# Patient Record
Sex: Female | Born: 1975 | Race: Black or African American | Hispanic: No | Smoking: Never smoker
Health system: Southern US, Community
[De-identification: ages and names within clinical notes are randomized; demographics above are authoritative.]

## PROBLEM LIST (undated history)

## (undated) DIAGNOSIS — E785 Hyperlipidemia, unspecified: Secondary | ICD-10-CM

## (undated) DIAGNOSIS — R87810 Cervical high risk human papillomavirus (HPV) DNA test positive: Secondary | ICD-10-CM

## (undated) DIAGNOSIS — E669 Obesity, unspecified: Secondary | ICD-10-CM

## (undated) HISTORY — DX: Obesity, unspecified: E66.9

## (undated) HISTORY — PX: WISDOM TOOTH EXTRACTION: SHX21

## (undated) HISTORY — DX: Hyperlipidemia, unspecified: E78.5

## (undated) HISTORY — DX: Cervical high risk human papillomavirus (HPV) DNA test positive: R87.810

---

## 2016-01-27 LAB — HM PAP SMEAR: HM PAP: NEGATIVE

## 2016-02-21 LAB — HM PAP SMEAR

## 2016-05-30 ENCOUNTER — Encounter: Payer: Self-pay | Admitting: Physician Assistant

## 2016-05-30 ENCOUNTER — Ambulatory Visit (INDEPENDENT_AMBULATORY_CARE_PROVIDER_SITE_OTHER): Payer: BLUE CROSS/BLUE SHIELD

## 2016-05-30 ENCOUNTER — Ambulatory Visit (INDEPENDENT_AMBULATORY_CARE_PROVIDER_SITE_OTHER): Payer: BLUE CROSS/BLUE SHIELD | Admitting: Physician Assistant

## 2016-05-30 VITALS — BP 138/93 | HR 92 | Ht 64.0 in | Wt 182.0 lb

## 2016-05-30 DIAGNOSIS — K5900 Constipation, unspecified: Secondary | ICD-10-CM | POA: Diagnosis not present

## 2016-05-30 DIAGNOSIS — Z131 Encounter for screening for diabetes mellitus: Secondary | ICD-10-CM | POA: Diagnosis not present

## 2016-05-30 DIAGNOSIS — E669 Obesity, unspecified: Secondary | ICD-10-CM

## 2016-05-30 DIAGNOSIS — R030 Elevated blood-pressure reading, without diagnosis of hypertension: Secondary | ICD-10-CM

## 2016-05-30 DIAGNOSIS — Z23 Encounter for immunization: Secondary | ICD-10-CM | POA: Diagnosis not present

## 2016-05-30 DIAGNOSIS — Z13220 Encounter for screening for lipoid disorders: Secondary | ICD-10-CM | POA: Diagnosis not present

## 2016-05-30 DIAGNOSIS — R1011 Right upper quadrant pain: Secondary | ICD-10-CM | POA: Diagnosis not present

## 2016-05-30 NOTE — Progress Notes (Signed)
Call pt: moderate amt of stool in colon that could be cause of tenderness and discomfort.  Restart probiotic. Start miralax 1 capful OTC twice a day with 8oz of fluid/gaterade until bowel movements are good.

## 2016-05-30 NOTE — Progress Notes (Signed)
   Subjective:    Patient ID: Julie Reese, female    DOB: 09/21/1975, 41 y.o.   MRN: [id number]  HPI  Pt is a 41 yo female who presents to the clinic to establish care.   .. Active Ambulatory Problems    Diagnosis Date Noted  . No Active Ambulatory Problems   Resolved Ambulatory Problems    Diagnosis Date Noted  . No Resolved Ambulatory Problems   No Additional Past Medical History   .Marland Kitchen Family History  Problem Relation Age of Onset  . Diabetes Mother   . Hypertension Mother   . High Cholesterol Father   . Diabetes Maternal Grandmother   . Diabetes Paternal Grandmother    .Marland Kitchen Social History   Social History  . Marital status: Single    Spouse name: N/A  . Number of children: N/A  . Years of education: N/A   Occupational History  . Not on file.   Social History Main Topics  . Smoking status: Never Smoker  . Smokeless tobacco: Never Used  . Alcohol use Yes  . Drug use: No  . Sexual activity: Not Currently   Other Topics Concern  . Not on file   Social History Narrative  . No narrative on file   She is concerned because she felt a "Knot" in her right upper quadrant for a few days. Has since resolved. She feels like she has been burping more. Bowel movements daily and regularly. Denies any melena or hematochezia. She feels "tenderness at times in right upper quadrant". Denies any nausea or vomiting. She admits to trying to get healthy and doing more sit ups recently. She has made numerous diet changes to lose weight as well.et healthy.    Review of Systems  All other systems reviewed and are negative.      Objective:   Physical Exam  Constitutional: She is oriented to person, place, and time. She appears well-developed and well-nourished.  HENT:  Head: Normocephalic and atraumatic.  Neck: Normal range of motion. Neck supple. No thyromegaly present.  Cardiovascular: Normal rate, regular rhythm and normal heart sounds.   Pulmonary/Chest: Effort normal  and breath sounds normal.  Abdominal: Soft. Bowel sounds are normal. She exhibits no distension and no mass. There is no tenderness. There is no rebound and no guarding.  Lymphadenopathy:    She has no cervical adenopathy.  Neurological: She is alert and oriented to person, place, and time.  Skin: Skin is dry.  Psychiatric: She has a normal mood and affect. Her behavior is normal.          Assessment & Plan:  Marland KitchenMarland KitchenDiagnoses and all orders for this visit:  RUQ pain -     DG Abd 1 View; Future -     DG Abd 1 View  Need for Tdap vaccination -     Tdap vaccine greater than or equal to 7yo IM  Obesity (BMI 30.0-34.9) -     TSH  Screening for diabetes mellitus -     COMPLETE METABOLIC PANEL WITH GFR  Screening for lipid disorders -     Lipid panel  Elevated blood pressure reading   Unclear etiology.  Will get abd xray.  Reassured patient of normal abdominal exam.  Will get labs.  Consider starting probiotics.  Follow up in 1 month for CPe.   Xray showed moderate stool burden. Will try miralax.

## 2016-05-31 DIAGNOSIS — E66811 Obesity, class 1: Secondary | ICD-10-CM

## 2016-05-31 DIAGNOSIS — R1011 Right upper quadrant pain: Secondary | ICD-10-CM | POA: Insufficient documentation

## 2016-05-31 DIAGNOSIS — E669 Obesity, unspecified: Secondary | ICD-10-CM | POA: Insufficient documentation

## 2016-05-31 HISTORY — DX: Obesity, unspecified: E66.9

## 2016-05-31 HISTORY — DX: Obesity, class 1: E66.811

## 2016-06-02 LAB — LIPID PANEL
CHOL/HDL RATIO: 5.5 ratio — AB (ref <5.0)
Cholesterol: 221 mg/dL — ABNORMAL HIGH (ref <200)
HDL: 40 mg/dL — ABNORMAL LOW (ref >50)
LDL CALC: 169 mg/dL — AB (ref <100)
Triglycerides: 61 mg/dL (ref <150)
VLDL: 12 mg/dL (ref <30)

## 2016-06-02 LAB — COMPLETE METABOLIC PANEL WITH GFR
ALT: 10 U/L (ref 6–29)
AST: 12 U/L (ref 10–30)
Albumin: 4.8 g/dL (ref 3.6–5.1)
Alkaline Phosphatase: 47 U/L (ref 33–115)
BUN: 10 mg/dL (ref 7–25)
CO2: 26 mmol/L (ref 20–31)
Calcium: 9.4 mg/dL (ref 8.6–10.2)
Chloride: 103 mmol/L (ref 98–110)
Creat: 0.83 mg/dL (ref 0.50–1.10)
GFR, Est African American: >89 mL/min (ref >=60)
GFR, Est Non African American: >89 mL/min (ref >=60)
GLUCOSE: 87 mg/dL (ref 65–99)
POTASSIUM: 4.1 mmol/L (ref 3.5–5.3)
SODIUM: 138 mmol/L (ref 135–146)
Total Bilirubin: 0.7 mg/dL (ref 0.2–1.2)
Total Protein: 8 g/dL (ref 6.1–8.1)

## 2016-06-02 LAB — TSH: TSH: 1.1 mIU/L

## 2016-06-05 ENCOUNTER — Encounter: Payer: Self-pay | Admitting: Physician Assistant

## 2016-06-05 DIAGNOSIS — E785 Hyperlipidemia, unspecified: Secondary | ICD-10-CM

## 2016-06-05 HISTORY — DX: Hyperlipidemia, unspecified: E78.5

## 2016-06-05 NOTE — Progress Notes (Signed)
Call pt: thyroid normal.  Kidney, liver, glucose.  LDL is elevated. Low fat diet, exercise could help. Goal under 100. I would consider statin daily medication. Will you consider.

## 2016-06-18 ENCOUNTER — Encounter: Payer: Self-pay | Admitting: Physician Assistant

## 2016-06-21 ENCOUNTER — Ambulatory Visit (INDEPENDENT_AMBULATORY_CARE_PROVIDER_SITE_OTHER): Payer: BLUE CROSS/BLUE SHIELD | Admitting: Physician Assistant

## 2016-06-21 ENCOUNTER — Encounter: Payer: Self-pay | Admitting: Physician Assistant

## 2016-06-21 VITALS — BP 138/78 | HR 103 | Temp 98.5°F | Ht 64.0 in | Wt 182.0 lb

## 2016-06-21 DIAGNOSIS — J069 Acute upper respiratory infection, unspecified: Secondary | ICD-10-CM | POA: Diagnosis not present

## 2016-06-21 DIAGNOSIS — R05 Cough: Secondary | ICD-10-CM | POA: Diagnosis not present

## 2016-06-21 DIAGNOSIS — R059 Cough, unspecified: Secondary | ICD-10-CM

## 2016-06-21 MED ORDER — IPRATROPIUM BROMIDE 0.06 % NA SOLN: 2.0000 | Freq: Four times a day (QID) | NASAL | 2 refills | Status: DC

## 2016-06-21 MED ORDER — BENZONATATE 200 MG PO CAPS: 200.0000 mg | ORAL_CAPSULE | Freq: Three times a day (TID) | ORAL | 0 refills | Status: DC | PRN

## 2016-06-21 NOTE — Progress Notes (Signed)
   Subjective:    Patient ID: Julie Reese, female    DOB: [birth year], 41 y.o.   MRN: [id number]  HPI  Pt is a 41 yo female who presents to the clinic with cough and congestion since Sunday, 3 days ago. Sunday she ran a low grade fever that has resolved. Monday she felt more fatigued. She has been taking delsym, mucinex, tylenol and sudafed. It is helping. She denies any problems sleeping. She denies any sinus pressure, ear pain, ST, wheezing or SOB.    Review of Systems    see HPI.  Objective:   Physical Exam  Constitutional: She is oriented to person, place, and time. She appears well-developed and well-nourished.  HENT:  Head: Normocephalic and atraumatic.  Right Ear: External ear normal.  Left Ear: External ear normal.  Mouth/Throat: Oropharynx is clear and moist.  TM's clear bilaterally.  Negative for any sinus tenderness.  Bilateral nasal turbinates red and swollen.    Eyes: Conjunctivae are normal. Right eye exhibits no discharge. Left eye exhibits no discharge.  Neck: Normal range of motion. Neck supple.  Cardiovascular: Normal rate, regular rhythm and normal heart sounds.   Pulmonary/Chest: Effort normal and breath sounds normal.  Lymphadenopathy:    She has cervical adenopathy.  Neurological: She is alert and oriented to person, place, and time.  Psychiatric: She has a normal mood and affect. Her behavior is normal.          Assessment & Plan:  Marland KitchenMarland KitchenNilda was seen today for cough.  Diagnoses and all orders for this visit:  Acute upper respiratory infection -     benzonatate (TESSALON) 200 MG capsule; Take 1 capsule (200 mg total) by mouth 3 (three) times daily as needed for cough. -     ipratropium (ATROVENT) 0.06 % nasal spray; Place 2 sprays into both nostrils 4 (four) times daily.  Cough -     benzonatate (TESSALON) 200 MG capsule; Take 1 capsule (200 mg total) by mouth 3 (three) times daily as needed for cough. -     ipratropium (ATROVENT) 0.06 %  nasal spray; Place 2 sprays into both nostrils 4 (four) times daily.   Symptomatic care discussed. Continue OTC mucinex, delsym, tylenol. May add ibuprofen as needed in alternation with tylenol. Tessalon given as needed as well as nasal spray. Follow up as needed.

## 2016-06-21 NOTE — Patient Instructions (Signed)
Upper Respiratory Infection, Adult Most upper respiratory infections (URIs) are caused by a virus. A URI affects the nose, throat, and upper air passages. The most common type of URI is often called "the common cold." Follow these instructions at home:  Take medicines only as told by your doctor.  Gargle warm saltwater or take cough drops to comfort your throat as told by your doctor.  Use a warm mist humidifier or inhale steam from a shower to increase air moisture. This may make it easier to breathe.  Drink enough fluid to keep your pee (urine) clear or pale yellow.  Eat soups and other clear broths.  Have a healthy diet.  Rest as needed.  Go back to work when your fever is gone or your doctor says it is okay.  You may need to stay home longer to avoid giving your URI to others.  You can also wear a face mask and wash your hands often to prevent spread of the virus.  Use your inhaler more if you have asthma.  Do not use any tobacco products, including cigarettes, chewing tobacco, or electronic cigarettes. If you need help quitting, ask your doctor. Contact a doctor if:  You are getting worse, not better.  Your symptoms are not helped by medicine.  You have chills.  You are getting more short of breath.  You have brown or red mucus.  You have yellow or brown discharge from your nose.  You have pain in your face, especially when you bend forward.  You have a fever.  You have puffy (swollen) neck glands.  You have pain while swallowing.  You have white areas in the back of your throat. Get help right away if:  You have very bad or constant:  Headache.  Ear pain.  Pain in your forehead, behind your eyes, and over your cheekbones (sinus pain).  Chest pain.  You have long-lasting (chronic) lung disease and any of the following:  Wheezing.  Long-lasting cough.  Coughing up blood.  A change in your usual mucus.  You have a stiff neck.  You have  changes in your:  Vision.  Hearing.  Thinking.  Mood. This information is not intended to replace advice given to you by your health care provider. Make sure you discuss any questions you have with your health care provider. Document Released: 09/13/2007 Document Revised: 11/28/2015 Document Reviewed: 07/02/2013 Elsevier Interactive Patient Education  2017 [employer] Inc[employer]  

## 2016-06-27 ENCOUNTER — Encounter: Payer: Self-pay | Admitting: Physician Assistant

## 2016-06-27 ENCOUNTER — Ambulatory Visit (INDEPENDENT_AMBULATORY_CARE_PROVIDER_SITE_OTHER): Payer: BLUE CROSS/BLUE SHIELD | Admitting: Physician Assistant

## 2016-06-27 VITALS — BP 131/78 | HR 85 | Temp 98.1°F | Ht 64.0 in | Wt 179.0 lb

## 2016-06-27 DIAGNOSIS — E7800 Pure hypercholesterolemia, unspecified: Secondary | ICD-10-CM | POA: Diagnosis not present

## 2016-06-27 DIAGNOSIS — R058 Other specified cough: Secondary | ICD-10-CM

## 2016-06-27 DIAGNOSIS — R87810 Cervical high risk human papillomavirus (HPV) DNA test positive: Secondary | ICD-10-CM

## 2016-06-27 DIAGNOSIS — M67471 Ganglion, right ankle and foot: Secondary | ICD-10-CM

## 2016-06-27 DIAGNOSIS — R05 Cough: Secondary | ICD-10-CM

## 2016-06-27 DIAGNOSIS — E669 Obesity, unspecified: Secondary | ICD-10-CM

## 2016-06-27 DIAGNOSIS — Z0000 Encounter for general adult medical examination without abnormal findings: Secondary | ICD-10-CM | POA: Diagnosis not present

## 2016-06-27 MED ORDER — PREDNISONE 50 MG PO TABS
ORAL_TABLET | ORAL | 0 refills | Status: DC
Start: 2016-06-27 — End: 2016-10-18

## 2016-06-27 NOTE — Patient Instructions (Addendum)
Red yeast rice 1200mg  twice a day.  Vitamin D 1000 units and calcium 4 servings a day.    Dyslipidemia Dyslipidemia is an imbalance of waxy, fat-like substances (lipids) in the blood. The body needs lipids in small amounts. Dyslipidemia often involves a high level of cholesterol or triglycerides, which are types of lipids. Common forms of dyslipidemia include:  High levels of bad cholesterol (LDL cholesterol). LDL is the type of cholesterol that causes fatty deposits (plaques) to build up in the blood vessels that carry blood away from your heart (arteries).  Low levels of good cholesterol (HDL cholesterol). HDL cholesterol is the type of cholesterol that protects against heart disease. High levels of HDL remove the LDL buildup from arteries.  High levels of triglycerides. Triglycerides are a fatty substance in the blood that is linked to a buildup of plaques in the arteries. You can develop dyslipidemia because of the genes you are born with (primary dyslipidemia) or changes that occur during your life (secondary dyslipidemia), or as a side effect of certain medical treatments. What are the causes? Primary dyslipidemia is caused by changes (mutations) in genes that are passed down through families (inherited). These mutations cause several types of dyslipidemia. Mutations can result in disorders that make the body produce too much LDL cholesterol or triglycerides, or not enough HDL cholesterol. These disorders may lead to heart disease, arterial disease, or stroke at an early age. Causes of secondary dyslipidemia include certain lifestyle choices and diseases that lead to dyslipidemia, such as:  Eating a diet that is high in animal fat.  Not getting enough activity or exercise (having a sedentary lifestyle).  Having diabetes, kidney disease, liver disease, or thyroid disease.  Drinking large amounts of alcohol.  Using certain types of drugs. What increases the risk? You may be at greater  risk for dyslipidemia if you are an older man or if you are a woman who has gone through menopause. Other risk factors include:  Having a family history of dyslipidemia.  Taking certain medicines, including birth control pills, steroids, some diuretics, beta-blockers, and some medicines forHIV.  Smoking cigarettes.  Eating a high-fat diet.  Drinking large amounts of alcohol.  Having certain medical conditions such as diabetes, polycystic ovary syndrome (PCOS), pregnancy, kidney disease, liver disease, or hypothyroidism.  Not exercising regularly.  Being overweight or obese with too much belly fat. What are the signs or symptoms? Dyslipidemia does not usually cause any symptoms. Very high lipid levels can cause fatty bumps under the skin (xanthomas) or a white or gray ring around the black center (pupil) of the eye. Very high triglyceride levels can cause inflammation of the pancreas (pancreatitis). How is this diagnosed? Your health care provider may diagnose dyslipidemia based on a routine blood test (fasting blood test). Because most people do not have symptoms of the condition, this blood testing (lipid profile) is done on adults age 37 and older and is repeated every 5 years. This test checks:  Total cholesterol. This is a measure of the total amount of cholesterol in your blood, including LDL cholesterol, HDL cholesterol, and triglycerides. A healthy number is below 200.  LDL cholesterol. The target number for LDL cholesterol is different for each person, depending on individual risk factors. For most people, a number below 100 is healthy. Ask your health care provider what your LDL cholesterol number should be.  HDL cholesterol. An HDL level of 60 or higher is best because it helps to protect against heart disease. A number  below 69 for men or below 47 for women increases the risk for heart disease.  Triglycerides. A healthy triglyceride number is below 150. If your lipid profile  is abnormal, your health care provider may do other blood tests to get more information about your condition. How is this treated? Treatment depends on the type of dyslipidemia that you have and your other risk factors for heart disease and stroke. Your health care provider will have a target range for your lipid levels based on this information. For many people, treatment starts with lifestyle changes, such as diet and exercise. Your health care provider may recommend that you:  Get regular exercise.  Make changes to your diet.  Quit smoking if you smoke. If diet changes and exercise do not help you reach your goals, your health care provider may also prescribe medicine to lower lipids. The most commonly prescribed type of medicine lowers your LDL cholesterol (statin drug). If you have a high triglyceride level, your provider may prescribe another type of drug (fibrate) or an omega-3 fish oil supplement, or both. Follow these instructions at home:  Take over-the-counter and prescription medicines only as told by your health care provider. This includes supplements.  Get regular exercise. Start an aerobic exercise and strength training program as told by your health care provider. Ask your health care provider what activities are safe for you. Your health care provider may recommend:  30 minutes of aerobic activity 4-6 days a week. Brisk walking is an example of aerobic activity.  Strength training 2 days a week.  Eat a healthy diet as told by your health care provider. This can help you reach and maintain a healthy weight, lower your LDL cholesterol, and raise your HDL cholesterol. It may help to work with a diet and nutrition specialist (dietitian) to make a plan that is right for you. Your dietitian or health care provider may recommend:  Limiting your calories, if you are overweight.  Eating more fruits, vegetables, whole grains, fish, and lean meats.  Limiting saturated fat, trans  fat, and cholesterol.  Follow instructions from your health care provider or dietitian about eating or drinking restrictions.  Limit alcohol intake to no more than one drink per day for nonpregnant women and two drinks per day for men. One drink equals 12 oz of beer, 5 oz of wine, or 1 oz of hard liquor.  Do not use any products that contain nicotine or tobacco, such as cigarettes and e-cigarettes. If you need help quitting, ask your health care provider.  Keep all follow-up visits as told by your health care provider. This is important. Contact a health care provider if:  You are having trouble sticking to your exercise or diet plan.  You are struggling to quit smoking or control your use of alcohol. Summary  Dyslipidemia is an imbalance of waxy, fat-like substances (lipids) in the blood. The body needs lipids in small amounts. Dyslipidemia often involves a high level of cholesterol or triglycerides, which are types of lipids.  Treatment depends on the type of dyslipidemia that you have and your other risk factors for heart disease and stroke.  For many people, treatment starts with lifestyle changes, such as diet and exercise. Your health care provider may also prescribe medicine to lower lipids. This information is not intended to replace advice given to you by your health care provider. Make sure you discuss any questions you have with your health care provider. Document Released: 04/01/2013 Document Revised: 11/22/2015 Document  Reviewed: 11/22/2015 Elsevier Interactive Patient Education  2017 [employer] Inc.   Ganglion Cyst A ganglion cyst is a noncancerous, fluid-filled lump that occurs near joints or tendons. The ganglion cyst grows out of a joint or the lining of a tendon. It most often develops in the hand or wrist, but it can also develop in the shoulder, elbow, hip, knee, ankle, or foot. The round or oval ganglion cyst can be the size of a pea or larger than a grape. Increased  activity may enlarge the size of the cyst because more fluid starts to build up. What are the causes? It is not known what causes a ganglion cyst to grow. However, it may be related to:  Inflammation or irritation around the joint.  An injury.  Repetitive movements or overuse.  Arthritis. What increases the risk? Risk factors include:  Being a woman.  Being age 31-50. What are the signs or symptoms? Symptoms may include:  A lump. This most often appears on the hand or wrist, but it can occur in other areas of the body.  Tingling.  Pain.  Numbness.  Muscle weakness.  Weak grip.  Less movement in a joint. How is this diagnosed? Ganglion cysts are most often diagnosed based on a physical exam. Your health care provider will feel the lump and may shine a light alongside it. If it is a ganglion cyst, a light often shines through it. Your health care provider may order an X-ray, ultrasound, or MRI to rule out other conditions. How is this treated? Ganglion cysts usually go away on their own without treatment. If pain or other symptoms are involved, treatment may be needed. Treatment is also needed if the ganglion cyst limits your movement or if it gets infected. Treatment may include:  Wearing a brace or splint on your wrist or finger.  Taking anti-inflammatory medicine.  Draining fluid from the lump with a needle (aspiration).  Injecting a steroid into the joint.  Surgery to remove the ganglion cyst. Follow these instructions at home:  Do not press on the ganglion cyst, poke it with a needle, or hit it.  Take medicines only as directed by your health care provider.  Wear your brace or splint as directed by your health care provider.  Watch your ganglion cyst for any changes.  Keep all follow-up visits as directed by your health care provider. This is important. Contact a health care provider if:  Your ganglion cyst becomes larger or more painful.  You have  increased redness, red streaks, or swelling.  You have pus coming from the lump.  You have weakness or numbness in the affected area.  You have a fever or chills. This information is not intended to replace advice given to you by your health care provider. Make sure you discuss any questions you have with your health care provider. Document Released: 03/24/2000 Document Revised: 09/02/2015 Document Reviewed: 09/09/2013 Elsevier Interactive Patient Education  2017 [employer].

## 2016-06-27 NOTE — Progress Notes (Signed)
Subjective:    Patient ID: Julie Reese, female    DOB: Nov 28, 1975, 40 y.o.   MRN: [id number]  HPI Pt is a 41 yo female who presents to the clinic CPE.   She had last pap done in 01/2016 was HPV positive will follow back up with them in October. No need for mammogram.   She has already had labs drawn will discuss today.   She is working on obesity with diet and exercise.   She was seen for URI and has a persisent cough. Does not feel bad. She denies any sinus pressure, headache, fever, chills, SOB.   She does have a knot on the top of her right foot that she would like looked at. It has been there for years after dropping a can of food on the top of it. It only bothers her when she wears certain shoes that rub the area. She is not taking anything for it.   .. Active Ambulatory Problems    Diagnosis Date Noted  . Obesity (BMI 30.0-34.9) 05/31/2016  . RUQ pain 05/31/2016  . Hyperlipidemia 06/05/2016  . Ganglion cyst of right foot 06/27/2016  . Post-viral cough syndrome 06/27/2016  . Pap smear of cervix shows high risk HPV present 06/28/2016   Resolved Ambulatory Problems    Diagnosis Date Noted  . No Resolved Ambulatory Problems   No Additional Past Medical History   .Marland Kitchen Family History  Problem Relation Age of Onset  . Diabetes Mother   . Hypertension Mother   . High Cholesterol Father   . Diabetes Maternal Grandmother   . Diabetes Paternal Grandmother    .Marland Kitchen Social History   Social History  . Marital status: Single    Spouse name: N/A  . Number of children: N/A  . Years of education: N/A   Occupational History  . Not on file.   Social History Main Topics  . Smoking status: Never Smoker  . Smokeless tobacco: Never Used  . Alcohol use Yes  . Drug use: No  . Sexual activity: Not Currently   Other Topics Concern  . Not on file   Social History Narrative  . No narrative on file     Review of Systems  All other systems reviewed and are  negative.      Objective:   Physical Exam  Constitutional: She is oriented to person, place, and time. She appears well-developed and well-nourished.  HENT:  Head: Normocephalic and atraumatic.  Right Ear: External ear normal.  Left Ear: External ear normal.  Nose: Nose normal.  Mouth/Throat: Oropharynx is clear and moist. No oropharyngeal exudate.  Eyes: Conjunctivae and EOM are normal. Pupils are equal, round, and reactive to light. Right eye exhibits no discharge. Left eye exhibits no discharge.  Neck: Normal range of motion. Neck supple. No thyromegaly present.  Cardiovascular: Normal rate, regular rhythm and normal heart sounds.   Pulmonary/Chest: Effort normal and breath sounds normal. She has no wheezes.  Abdominal: Soft. Bowel sounds are normal. She exhibits no distension. There is no tenderness. There is no rebound and no guarding.  Musculoskeletal:  1cm by 1cm cystic structure on dorsal foot. Light is able to pass through it.  Very minimal tenderness to palpation.  NROM of right foot.  Strength 5/5.   Lymphadenopathy:    She has no cervical adenopathy.  Neurological: She is alert and oriented to person, place, and time. She has normal reflexes. No cranial nerve deficit.  Psychiatric: She has a normal mood  and affect. Her behavior is normal.          Assessment & Plan:  Marland KitchenMarland KitchenDiagnoses and all orders for this visit:  Routine physical examination  Pure hypercholesterolemia  Obesity (BMI 30.0-34.9)  Post-viral cough syndrome  Ganglion cyst of right foot -     Ambulatory referral to Podiatry  Pap smear of cervix shows high risk HPV present  Other orders -     predniSONE (DELTASONE) 50 MG tablet; Take one tablet for 5 days.  .. Depression screen [facility] 2/9 06/27/2016  Decreased Interest 0  Down, Depressed, Hopeless 0  PHQ - 2 Score 0    Encouraged red yeast rice and making low fat diet changes with exercise. Will recheck lipid level in 6 months.  At least 150  minutes of exercise a week.  Keep calories to around 1500 a day.   Cough likely post viral. Reassurance given today that lungs sound great. Prednisone for 5 days given.   Continue to follow with GYN due to high risk HPV.   Referral made to consider removal of cyst on right foot.

## 2016-06-28 DIAGNOSIS — R87810 Cervical high risk human papillomavirus (HPV) DNA test positive: Secondary | ICD-10-CM

## 2016-06-28 HISTORY — DX: Cervical high risk human papillomavirus (HPV) DNA test positive: R87.810

## 2016-07-03 ENCOUNTER — Encounter: Payer: Self-pay | Admitting: Podiatry

## 2016-07-03 ENCOUNTER — Ambulatory Visit (INDEPENDENT_AMBULATORY_CARE_PROVIDER_SITE_OTHER): Payer: BLUE CROSS/BLUE SHIELD | Admitting: Podiatry

## 2016-07-03 VITALS — BP 116/79 | HR 76 | Ht 64.0 in | Wt 179.0 lb

## 2016-07-03 DIAGNOSIS — D2121 Benign neoplasm of connective and other soft tissue of right lower limb, including hip: Secondary | ICD-10-CM | POA: Diagnosis not present

## 2016-07-03 DIAGNOSIS — M79671 Pain in right foot: Secondary | ICD-10-CM | POA: Diagnosis not present

## 2016-07-03 DIAGNOSIS — M21969 Unspecified acquired deformity of unspecified lower leg: Secondary | ICD-10-CM | POA: Diagnosis not present

## 2016-07-03 NOTE — Patient Instructions (Signed)
Seen for multiple foot issues, right great toe nail, knot on right foot. Noted of tight Achilles tendon on both, fibrous knot on right dorsum, mild discolored right great toe nail, and elevated first ray right. Need daily stretch exercise. Need Custom orthotics. Metatarsal binder dispensed x 1.

## 2016-07-03 NOTE — Progress Notes (Signed)
SUBJECTIVE: 41 y.o. year old female presents complaining of a palpable mass over right foot, and nail problem on right great toe. Experiencing occasonal pain over the right foot over mid tarsal area with raised mass. Hurts only with certain shoes. The mass increases in size at times. Also having problem with toe nail problem on right. Hx of broken right ankle and sprain left ankle in 2009.  REVIEW OF SYSTEMS: Pertinent items noted in HPI and remainder of comprehensive ROS otherwise negative.  OBJECTIVE: DERMATOLOGIC EXAMINATION: Discolored medial and lateral nail margin right great toe. Mild lift at distal end right great toe.  VASCULAR EXAMINATION OF LOWER LIMBS: All pedal pulses are palpable with normal pulsation.  Prominent dorsalis pedis artery over the palpable mass right foot. Capillary Filling times within 3 seconds in all digits.  No edema or erythema noted. Temperature gradient from tibial crest to dorsum of foot is within normal bilateral.  NEUROLOGIC EXAMINATION OF THE LOWER LIMBS: All epicritic and tactile sensations grossly intact.  MUSCULOSKELETAL EXAMINATION: Positive for bunion deformity L>L. Excess sagittal plane motion of the first ray bilateral. Tight Achilles tendon bilateral. Firma palpable mass, 1 cm in diameter over dorsum just lateral to Anterior tibialis tendon right foot.  ASSESSMENT: Ankle Equinus bilateral. Bunion left. 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   PLAN: Reviewed clinical findings and available treatment options. Metatarsal binder dispensed to use when the mass becomes swollen or enlarged. May need custom orthotics.

## 2016-07-11 ENCOUNTER — Encounter: Payer: Self-pay | Admitting: Physician Assistant

## 2016-10-18 ENCOUNTER — Encounter: Payer: Self-pay | Admitting: Family Medicine

## 2016-10-18 ENCOUNTER — Ambulatory Visit (INDEPENDENT_AMBULATORY_CARE_PROVIDER_SITE_OTHER): Payer: BLUE CROSS/BLUE SHIELD | Admitting: Family Medicine

## 2016-10-18 DIAGNOSIS — M1712 Unilateral primary osteoarthritis, left knee: Secondary | ICD-10-CM | POA: Diagnosis not present

## 2016-10-18 DIAGNOSIS — M7652 Patellar tendinitis, left knee: Secondary | ICD-10-CM | POA: Diagnosis not present

## 2016-10-18 MED ORDER — DICLOFENAC SODIUM 1 % TD GEL: 4.0000 g | Freq: Four times a day (QID) | TRANSDERMAL | 11 refills | Status: DC

## 2016-10-18 NOTE — Patient Instructions (Signed)
Thank you for coming in today. Do the home exercises.  Apply voltaren gel.  Recheck in 2-4 weeks    Patellar Tendinitis Rehab Ask your health care provider which exercises are safe for you. Do exercises exactly as told by your health care provider and adjust them as directed. It is normal to feel mild stretching, pulling, tightness, or discomfort as you do these exercises, but you should stop right away if you feel sudden pain or your pain gets worse.Do not begin these exercises until told by your health care provider. Stretching and range of motion exercises This exercise warms up your muscles and joints and improves the movement and flexibility of your knee. This exercise also helps to relieve pain and stiffness. Exercise A: Hamstring, doorway  1. Lie on your back in front of a doorway with your __________ leg resting against the wall and your other leg flat on the floor in the doorway. There should be a slight bend in your __________ knee. 2. Straighten your __________ knee. You should feel a stretch behind your knee or thigh. If you do not, scoot your buttocks closer to the door. 3. Hold this position for __________ seconds. Repeat __________ times. Complete this stretch __________ times a day. Strengthening exercises These exercises build strength and endurance in your knee. Endurance is the ability to use your muscles for a long time, even after they get tired. Exercise B: Quadriceps, isometric  1. Lie on your back with your __________ leg extended and your other knee bent. 2. Slowly tense the muscles in the front of your __________ thigh. When you do this, you should see your kneecap slide up toward your hip or see increased dimpling just above the knee. This motion will push the back of your knee toward the floor. If this is painful, try putting a rolled-up hand towel under your knee to support it in a bent position. Change the size of the towel to find a position that allows you to do  this exercise without any pain. 3. For __________ seconds, hold the muscle as tight as you can without increasing your pain. 4. Relax the muscles slowly and completely. Repeat __________ times. Complete this exercise __________ times a day. Exercise C: Straight leg raises ( quadriceps) 1. Lie on your back with your __________ leg extended and your other knee bent. 2. Tense the muscles in the front of your __________ thigh. When you do this, you should see your kneecap slide up or see increased dimpling just above the knee. 3. Keep these muscles tight as you raise your leg 4-6 inches (10-15 cm) off the floor. Do not let your moving knee bend. 4. Hold this position for __________ seconds. 5. Keep these muscles tense as you slowly lower your leg. 6. Relax your muscles slowly and completely. Repeat __________ times. Complete this exercise __________ times a day. Exercise D: Squats 1. Stand in front of a table, with your feet and knees pointing straight ahead. You may rest your hands on the table for balance but not for support. 2. Slowly bend your knees and lower your hips like you are going to sit in a chair. ? Keep your weight over your heels, not over your toes. ? Keep your lower legs upright so they are parallel with the table legs. ? Do not let your hips go lower than your knees. ? Do not bend lower than told by your health care provider. ? If your knee pain increases, do not bend as  low. 3. Hold the squat position for __________ seconds. 4. Slowly push with your legs to return to standing. Do not use your hands to pull yourself to standing. Repeat __________ times. Complete this exercise __________ times a day. Exercise E: Step-downs 1. Stand on the edge of a step. 2. Keeping your weight over your __________ heel, slowly bend your __________ knee to bring your __________ heel toward the floor. Lower your heel as far as you can while keeping control and without increasing any  discomfort. ? Do not let your __________ knee come forward. ? Use your leg muscles, not gravity, to lower your body. ? Hold a wall or rail for balance if needed. 3. Slowly push through your heel to lift your body weight back up. 4. Return to the starting position. Repeat __________ times. Complete this exercise __________ times a day. Exercise F: Straight leg raises ( hip abductors) 1. Lie on your side with your __________ leg in the top position. Lie so your head, shoulder, knee, and hip line up. You may bend your lower knee to help you keep your balance. 2. Roll your hips slightly forward, so that your hips are stacked directly over each other and your __________ knee is facing forward. 3. Leading with your heel, lift your top leg 4-6 inches (10-15 cm). You should feel the muscles in your outer hip lifting. ? Do not let your foot drift forward. ? Do not let your knee roll toward the ceiling. 4. Hold this position for __________ seconds. 5. Slowly lower your leg to the starting position. 6. Let your muscles relax completely after each repetition. Repeat __________ times. Complete this exercise __________ times a day. This information is not intended to replace advice given to you by your health care provider. Make sure you discuss any questions you have with your health care provider. Document Released: 03/27/2005 Document Revised: 12/02/2015 Document Reviewed: 12/29/2014 Elsevier Interactive Patient Education  [employer].

## 2016-10-18 NOTE — Progress Notes (Signed)
   Julie Reese is a 41 y.o. female who presents to [facility] today for left knee pain and swelling. Patient notes a one-week history of left anterior knee pain. Patient squatted and felt a pop in her left knee. She notes pain along the anterior aspect of the knee worse with climbing stairs. She has been using ice and menthol-containing rubs which have helped. She denies significant locking or catching. No radiating pain weakness or numbness.    No past medical history on file. Past Surgical History:  Procedure Laterality Date  . WISDOM TOOTH EXTRACTION     Social History  Substance Use Topics  . Smoking status: Never Smoker  . Smokeless tobacco: Never Used  . Alcohol use Yes     ROS:  As above   Medications: Current Outpatient Prescriptions  Medication Sig Dispense Refill  . Multiple Vitamin (MULTIVITAMIN) capsule Take by mouth.    . Probiotic Product (PROBIOTIC-10 PO) Take by mouth.    . diclofenac sodium (VOLTAREN) 1 % GEL Apply 4 g topically 4 (four) times daily. To affected joint. Pt failed Ibuprofen and Naproxen 100 g 11   No current facility-administered medications for this visit.    Allergies  Allergen Reactions  . Percocet [Oxycodone-Acetaminophen] Other (See Comments)    "makes loopy and unaware of self"     Exam:  BP (!) 125/59   Pulse 76   Wt 189 lb (85.7 kg)   SpO2 100%   BMI 32.44 kg/m  General: Well Developed, well nourished, and in no acute distress.  Neuro/Psych: Alert and oriented x3, extra-ocular muscles intact, able to move all 4 extremities, sensation grossly intact. Skin: Warm and dry, no rashes noted.  Respiratory: Not using accessory muscles, speaking in full sentences, trachea midline.  Cardiovascular: Pulses palpable, no extremity edema. Abdomen: Does not appear distended. MSK: Left knee: No effusion no erythema normal-appearing Tender to palpation along the patellar tendon and on the  medial joint line. Normal range of motion 0-120 with 1+ retropatellar crepitations. Stable ligamentous exam. Positive medial McMurray's test. Pain with resisted extension strength testing however strength is intact. Nontender and no pain with resisted flexion strength testing.    No results found for this or any previous visit (from the past 48 hour(s)). No results found.    Assessment and Plan: 41 y.o. female with left knee pain very likely due to patellar tendinitis. There is a potential for a meniscus injury. Plan for topical diclofenac gel to adjust the tendinitis as well as the existing DJD. We'll avoid oral NSAIDs. Plan to also use home eccentric exercises. Recheck in 4 weeks.    No orders of the defined types were placed in this encounter.  Meds ordered this encounter  Medications  . Probiotic Product (PROBIOTIC-10 PO)    Sig: Take by mouth.  . diclofenac sodium (VOLTAREN) 1 % GEL    Sig: Apply 4 g topically 4 (four) times daily. To affected joint. Pt failed Ibuprofen and Naproxen    Dispense:  100 g    Refill:  11    Discussed warning signs or symptoms. Please see discharge instructions. Patient expresses understanding.

## 2016-11-15 ENCOUNTER — Encounter: Payer: Self-pay | Admitting: Family Medicine

## 2016-11-15 ENCOUNTER — Ambulatory Visit (INDEPENDENT_AMBULATORY_CARE_PROVIDER_SITE_OTHER): Payer: BLUE CROSS/BLUE SHIELD | Admitting: Family Medicine

## 2016-11-15 VITALS — BP 122/84 | HR 73 | Ht 64.0 in | Wt 187.0 lb

## 2016-11-15 DIAGNOSIS — M7652 Patellar tendinitis, left knee: Secondary | ICD-10-CM | POA: Diagnosis not present

## 2016-11-15 NOTE — Patient Instructions (Signed)
Thank you for coming in today. Continue the exercises.  Recheck with me as needed.

## 2016-11-15 NOTE — Progress Notes (Signed)
   Julie Reese is a 41 y.o. female who presents to [facility] today for follow-up left knee pain. Patient was seen a few weeks ago for patellar tendinitis of the left knee. She's been using topical diclofenac gel as well as home exercise program and has had complete resolution of pain. She feels great without symptoms.   Past Medical History:  Diagnosis Date  . Hyperlipidemia 06/05/2016  . Obesity (BMI 30.0-34.9) 05/31/2016  . Pap smear of cervix shows high risk HPV present 06/28/2016   Last pap 01/2016 with one year follow up.    Past Surgical History:  Procedure Laterality Date  . WISDOM TOOTH EXTRACTION     Social History  Substance Use Topics  . Smoking status: Never Smoker  . Smokeless tobacco: Never Used  . Alcohol use Yes     ROS:  As above   Medications: Current Outpatient Prescriptions  Medication Sig Dispense Refill  . diclofenac sodium (VOLTAREN) 1 % GEL Apply 4 g topically 4 (four) times daily. To affected joint. Pt failed Ibuprofen and Naproxen 100 g 11  . Multiple Vitamin (MULTIVITAMIN) capsule Take by mouth.    . Probiotic Product (PROBIOTIC-10 PO) Take by mouth.     No current facility-administered medications for this visit.    Allergies  Allergen Reactions  . Percocet [Oxycodone-Acetaminophen] Other (See Comments)    "makes loopy and unaware of self"     Exam:  BP 122/84   Pulse 73   Ht 5\' 4"  (1.626 m)   Wt 187 lb (84.8 kg)   BMI 32.10 kg/m  General: Well Developed, well nourished, and in no acute distress.  Neuro/Psych: Alert and oriented x3, extra-ocular muscles intact, able to move all 4 extremities, sensation grossly intact. Skin: Warm and dry, no rashes noted.  Respiratory: Not using accessory muscles, speaking in full sentences, trachea midline.  Cardiovascular: Pulses palpable, no extremity edema. Abdomen: Does not appear distended. MSK: Left knee normal-appearing nontender normal  motion.    No results found for this or any previous visit (from the past 48 hour(s)). No results found.    Assessment and Plan: 41 y.o. female with resolved patellar tendinitis of the left knee. Any continue quad strengthening exercises and use diclofenac gel intermittently as needed. Work on continued weight loss.    No orders of the defined types were placed in this encounter.  No orders of the defined types were placed in this encounter.   Discussed warning signs or symptoms. Please see discharge instructions. Patient expresses understanding.

## 2017-01-31 LAB — HM PAP SMEAR: HM Pap smear: NEGATIVE

## 2017-06-01 ENCOUNTER — Encounter: Payer: Self-pay | Admitting: Physician Assistant

## 2017-06-01 ENCOUNTER — Ambulatory Visit (INDEPENDENT_AMBULATORY_CARE_PROVIDER_SITE_OTHER): Payer: BLUE CROSS/BLUE SHIELD | Admitting: Physician Assistant

## 2017-06-01 VITALS — BP 113/52 | HR 87 | Ht 64.0 in | Wt 188.0 lb

## 2017-06-01 DIAGNOSIS — Z13220 Encounter for screening for lipoid disorders: Secondary | ICD-10-CM | POA: Diagnosis not present

## 2017-06-01 DIAGNOSIS — Z0000 Encounter for general adult medical examination without abnormal findings: Secondary | ICD-10-CM

## 2017-06-01 DIAGNOSIS — Z131 Encounter for screening for diabetes mellitus: Secondary | ICD-10-CM

## 2017-06-01 DIAGNOSIS — E6609 Other obesity due to excess calories: Secondary | ICD-10-CM | POA: Diagnosis not present

## 2017-06-01 DIAGNOSIS — Z6831 Body mass index (BMI) 31.0-31.9, adult: Secondary | ICD-10-CM | POA: Diagnosis not present

## 2017-06-01 LAB — LIPID PANEL W/REFLEX DIRECT LDL
Cholesterol: 232 mg/dL — ABNORMAL HIGH
HDL: 44 mg/dL — ABNORMAL LOW
LDL Cholesterol (Calc): 172 mg/dL — ABNORMAL HIGH
Non-HDL Cholesterol (Calc): 188 mg/dL — ABNORMAL HIGH
Total CHOL/HDL Ratio: 5.3 (calc) — ABNORMAL HIGH
Triglycerides: 68 mg/dL

## 2017-06-01 LAB — COMPLETE METABOLIC PANEL WITH GFR
AG RATIO: 1.5 (calc) (ref 1.0–2.5)
ALT: 11 U/L (ref 6–29)
AST: 17 U/L (ref 10–30)
Albumin: 4.6 g/dL (ref 3.6–5.1)
Alkaline phosphatase (APISO): 50 U/L (ref 33–115)
BILIRUBIN TOTAL: 0.7 mg/dL (ref 0.2–1.2)
BUN: 12 mg/dL (ref 7–25)
CALCIUM: 9.4 mg/dL (ref 8.6–10.2)
CHLORIDE: 104 mmol/L (ref 98–110)
CO2: 22 mmol/L (ref 20–32)
Creat: 0.78 mg/dL (ref 0.50–1.10)
GFR, EST NON AFRICAN AMERICAN: 96 mL/min/{1.73_m2} (ref >=60)
GFR, Est African American: 111 mL/min/{1.73_m2} (ref >=60)
GLOBULIN: 3.1 g/dL (ref 1.9–3.7)
Glucose, Bld: 82 mg/dL (ref 65–99)
POTASSIUM: 4.3 mmol/L (ref 3.5–5.3)
SODIUM: 139 mmol/L (ref 135–146)
Total Protein: 7.7 g/dL (ref 6.1–8.1)

## 2017-06-01 LAB — TSH: TSH: 1.02 m[IU]/L

## 2017-06-01 MED ORDER — PHENTERMINE HCL 37.5 MG PO CAPS: 37.5000 mg | ORAL_CAPSULE | ORAL | 0 refills | Status: DC

## 2017-06-01 NOTE — Progress Notes (Deleted)
   Subjective:    Patient ID: Julie Reese, female    DOB: [birth year], 42 y.o.   MRN: [id number]  HPI BMI 32. 3. Weight stay d  Nutritionist at job.   Dads mother double mascetimy.    Review of Systems     Objective:   Physical Exam        Assessment & Plan:

## 2017-06-01 NOTE — Patient Instructions (Signed)

## 2017-06-04 ENCOUNTER — Encounter: Payer: Self-pay | Admitting: Physician Assistant

## 2017-06-04 DIAGNOSIS — E785 Hyperlipidemia, unspecified: Secondary | ICD-10-CM | POA: Insufficient documentation

## 2017-06-04 NOTE — Progress Notes (Signed)
Subjective:     Julie Reese is a 42 y.o. female and is here for a comprehensive physical exam. The patient reports problems - her biggest concern is weight. she is seeing a nutritionist at work but not having great results. she has been attempting weight loss for over a year.     Social History   Socioeconomic History  . Marital status: Single    Spouse name: Not on file  . Number of children: Not on file  . Years of education: Not on file  . Highest education level: Not on file  Social Needs  . Financial resource strain: Not on file  . Food insecurity - worry: Not on file  . Food insecurity - inability: Not on file  . Transportation needs - medical: Not on file  . Transportation needs - non-medical: Not on file  Occupational History  . Not on file  Tobacco Use  . Smoking status: Never Smoker  . Smokeless tobacco: Never Used  Substance and Sexual Activity  . Alcohol use: Yes  . Drug use: No  . Sexual activity: Not Currently  Other Topics Concern  . Not on file  Social History Narrative  . Not on file   Health Maintenance  Topic Date Due  . INFLUENZA VACCINE  02/01/2018 (Originally 11/08/2016)  . HIV Screening  06/01/2018 (Originally 02/06/1993)  . PAP SMEAR  02/18/2019  . TETANUS/TDAP  05/30/2026    The following portions of the patient's history were reviewed and updated as appropriate: allergies, current medications, past family history, past medical history, past social history, past surgical history and problem list.  Review of Systems Pertinent items noted in HPI and remainder of comprehensive ROS otherwise negative.   Objective:    BP (!) 113/52   Pulse 87   Ht 5\' 4"  (1.626 m)   Wt 188 lb (85.3 kg)   BMI 32.27 kg/m  General appearance: alert, cooperative, mildly obese and moderately obese Head: Normocephalic, without obvious abnormality, atraumatic Eyes: conjunctivae/corneas clear. PERRL, EOM's intact. Fundi benign. Ears: normal TM's and external  ear canals both ears Nose: Nares normal. Septum midline. Mucosa normal. No drainage or sinus tenderness. Throat: lips, mucosa, and tongue normal; teeth and gums normal Neck: no adenopathy, no carotid bruit, no JVD, supple, symmetrical, trachea midline and thyroid not enlarged, symmetric, no tenderness/mass/nodules Back: symmetric, no curvature. ROM normal. No CVA tenderness. Lungs: clear to auscultation bilaterally Breasts: normal appearance, no masses or tenderness Heart: regular rate and rhythm, S1, S2 normal, no murmur, click, rub or gallop Abdomen: soft, non-tender; bowel sounds normal; no masses,  no organomegaly Extremities: extremities normal, atraumatic, no cyanosis or edema Pulses: 2+ and symmetric Skin: Skin color, texture, turgor normal. No rashes or lesions Lymph nodes: Cervical, supraclavicular, and axillary nodes normal. Neurologic: Alert and oriented X 3, normal strength and tone. Normal symmetric reflexes. Normal coordination and gait    Assessment:    Healthy female exam.      Plan:    Marland KitchenMarland KitchenShawneequa was seen today for annual exam.  Diagnoses and all orders for this visit:  Routine physical examination -     COMPLETE METABOLIC PANEL WITH GFR -     Lipid Panel w/reflex Direct LDL -     TSH  Screening for diabetes mellitus -     COMPLETE METABOLIC PANEL WITH GFR  Screening for lipid disorders -     Lipid Panel w/reflex Direct LDL  Class 1 obesity due to excess calories without serious comorbidity with  body mass index (BMI) of 31.0 to 31.9 in adult -     TSH -     phentermine 37.5 MG capsule; Take 1 capsule (37.5 mg total) by mouth every morning.   .. Depression screen [facility] 2/9 06/01/2017 06/27/2016  Decreased Interest 0 0  Down, Depressed, Hopeless 0 0  PHQ - 2 Score 0 0   .Marland Kitchen Discussed 150 minutes of exercise a week.  Encouraged vitamin D 1000 units and Calcium 1300mg  or 4 servings of dairy a day.  Fasting labs ordered.  Mammogram due in October 2019.  Pap  due in 02/2019.   Marland Kitchen.Discussed low carb diet with 1500 calories and 80g of protein.  Exercising at least 150 minutes a week.  [employer] could be a Microbiologist.  Discussed weight loss medications. Pt wanted to start phentermine. Discussed side effects. Follow up in 1 month with nurse visit.  See After Visit Summary for Counseling Recommendations

## 2017-06-04 NOTE — Progress Notes (Signed)
Call pt: thyroid perfect. Cholesterol is elevated. LDL was 172. HDL low as well. We could start with low fat diet, OTC red yeast rice 1200mg  bid and exercise and recheck in 6 months. If no change would need to consider cholesterol medication.

## 2017-06-20 ENCOUNTER — Encounter: Payer: Self-pay | Admitting: Physician Assistant

## 2017-06-29 ENCOUNTER — Telehealth: Payer: Self-pay | Admitting: Physician Assistant

## 2017-06-29 ENCOUNTER — Ambulatory Visit (INDEPENDENT_AMBULATORY_CARE_PROVIDER_SITE_OTHER): Payer: 59 | Admitting: Physician Assistant

## 2017-06-29 VITALS — BP 121/44 | HR 97 | Ht 63.0 in | Wt 176.0 lb

## 2017-06-29 DIAGNOSIS — Z6831 Body mass index (BMI) 31.0-31.9, adult: Secondary | ICD-10-CM | POA: Diagnosis not present

## 2017-06-29 DIAGNOSIS — R635 Abnormal weight gain: Secondary | ICD-10-CM

## 2017-06-29 DIAGNOSIS — E669 Obesity, unspecified: Secondary | ICD-10-CM

## 2017-06-29 DIAGNOSIS — E6609 Other obesity due to excess calories: Secondary | ICD-10-CM | POA: Diagnosis not present

## 2017-06-29 MED ORDER — PHENTERMINE HCL 37.5 MG PO CAPS: 37.5000 mg | ORAL_CAPSULE | ORAL | 0 refills | Status: DC

## 2017-06-29 NOTE — Progress Notes (Signed)
   Subjective:    Patient ID: Julie Reese, female    DOB: 04/05/1976, 42 y.o.   MRN: [id number]  HPI  Julie Reese is here for blood pressure and weight check. Diet and exercise is going well. Denies trouble sleeping or palpitations.   Review of Systems     Objective:   Physical Exam        Assessment & Plan:  Abnormal weight gain - Patient has lost weight(11 lbs) A refill on phentermine sent to [facility]. Patient advised to schedule a follow up in 4 weeks.   Agree with above plan. Iran Planas PA-C

## 2017-06-29 NOTE — Telephone Encounter (Signed)
That should be fine! I don't have any experience with this probiotic personally. Let me know how you like it.

## 2017-07-08 ENCOUNTER — Encounter: Payer: Self-pay | Admitting: Physician Assistant

## 2017-07-30 ENCOUNTER — Ambulatory Visit (INDEPENDENT_AMBULATORY_CARE_PROVIDER_SITE_OTHER): Payer: 59 | Admitting: Osteopathic Medicine

## 2017-07-30 DIAGNOSIS — Z6831 Body mass index (BMI) 31.0-31.9, adult: Secondary | ICD-10-CM | POA: Diagnosis not present

## 2017-07-30 DIAGNOSIS — E6609 Other obesity due to excess calories: Secondary | ICD-10-CM | POA: Diagnosis not present

## 2017-07-30 MED ORDER — PHENTERMINE HCL 37.5 MG PO CAPS: 37.5000 mg | ORAL_CAPSULE | ORAL | 0 refills | Status: DC

## 2017-07-30 NOTE — Progress Notes (Signed)
   Subjective:    Patient ID: Julie Reese, female    DOB: Aug 11, 1975, 42 y.o.   MRN: [id number]  HPI Patient is here for blood pressure and weight check. Denies trouble sleeping, palpitations, or medication problems.   Review of Systems     Objective:   Physical Exam        Assessment & Plan:  Pt haws lost weight since last weight check. Refill pended in office visit for provider to send. Pt advised to schedule another weight check in 4 weeks.

## 2017-07-30 NOTE — Progress Notes (Signed)
BP 130/74   Pulse (!) 101   Temp 98.6 F (37 C) (Oral)   Wt 168 lb (76.2 kg)   BMI 29.76 kg/m   Wt was 176 lb on 06/29/17 OK to refill phentermine today but further refills at discretion of PCP

## 2017-08-29 ENCOUNTER — Ambulatory Visit (INDEPENDENT_AMBULATORY_CARE_PROVIDER_SITE_OTHER): Payer: 59 | Admitting: Physician Assistant

## 2017-08-29 VITALS — BP 135/82 | HR 88 | Ht 64.0 in | Wt 161.4 lb

## 2017-08-29 DIAGNOSIS — R635 Abnormal weight gain: Secondary | ICD-10-CM | POA: Diagnosis not present

## 2017-08-29 NOTE — Progress Notes (Signed)
    Subjective:    Patient ID: Julie Reese, female    DOB: 03-Feb-1976, 42 y.o.   MRN: [id number]  HPI Patient is here for blood pressure and weight check. Denies trouble sleeping, palpitations, or medication problems.   Review of Systems     Objective:   Physical Exam        Assessment & Plan:  Patient has lost weight. First BP check was low at 122/49. Had pt push some fluids and rechecked after 5 minutes and it came up to 135/88. Pt is interested in coming off of medication. She has made diet, exercise, and lifestyle changes and states she "is not a pill person". She has discussed this with Luvenia Starch during nurse visit.   Took her off phentermine. Continue with diet and exercise. Follow up as needed. Goal weight to lose 10 more lbs. Iran Planas PA-C

## 2017-08-30 ENCOUNTER — Ambulatory Visit: Payer: BLUE CROSS/BLUE SHIELD

## 2017-10-01 ENCOUNTER — Ambulatory Visit (INDEPENDENT_AMBULATORY_CARE_PROVIDER_SITE_OTHER): Payer: 59 | Admitting: Physician Assistant

## 2017-10-01 VITALS — BP 118/67 | HR 79 | Temp 98.8°F | Wt 164.0 lb

## 2017-10-01 DIAGNOSIS — Z6831 Body mass index (BMI) 31.0-31.9, adult: Secondary | ICD-10-CM

## 2017-10-01 DIAGNOSIS — E6609 Other obesity due to excess calories: Secondary | ICD-10-CM

## 2017-10-01 NOTE — Progress Notes (Signed)
   Subjective:    Patient ID: Julie Reese, female    DOB: 08/08/1975, 42 y.o.   MRN: [id number]  HPI Patient is here for blood pressure and weight check. Denies trouble sleeping, palpitations, or medication problems.     Review of Systems     Objective:   Physical Exam        Assessment & Plan:  Patient has not lost weight, she gained 3 pounds since last visit. Pt stopped Phentermine after last nurse visit and states she feels much better. She states she experienced some headaches when coming off, but they have resolves. Initial BP check was elevated at 143/60. Pt reports being under a lot of stress at work, but states she is doing well with diet and exercise. Rechecked BP after 10 minutes and it came down to 118/67. Pt seeing nutritionist and does not wish to restart phentermine. As of now, no upcoming visits. I advised pt I would route note to Provider and let her advise on when next OV needs to be   1 year or if she would like to restart any weight loss conversation. Jade Breeback PA-C.

## 2017-10-02 NOTE — Progress Notes (Signed)
Left pt advising of Jade's f/u recommendations

## 2017-12-25 ENCOUNTER — Encounter: Payer: Self-pay | Admitting: Physician Assistant

## 2018-03-31 IMAGING — DX DG ABDOMEN 1V
1 series · 1 of 1 positions shown · non-contrast
Comparison: None.

CLINICAL DATA: Right upper quadrant pain for 1 week.  Constipation.

EXAM:
ABDOMEN - 1 VIEW

[abdomen kub]
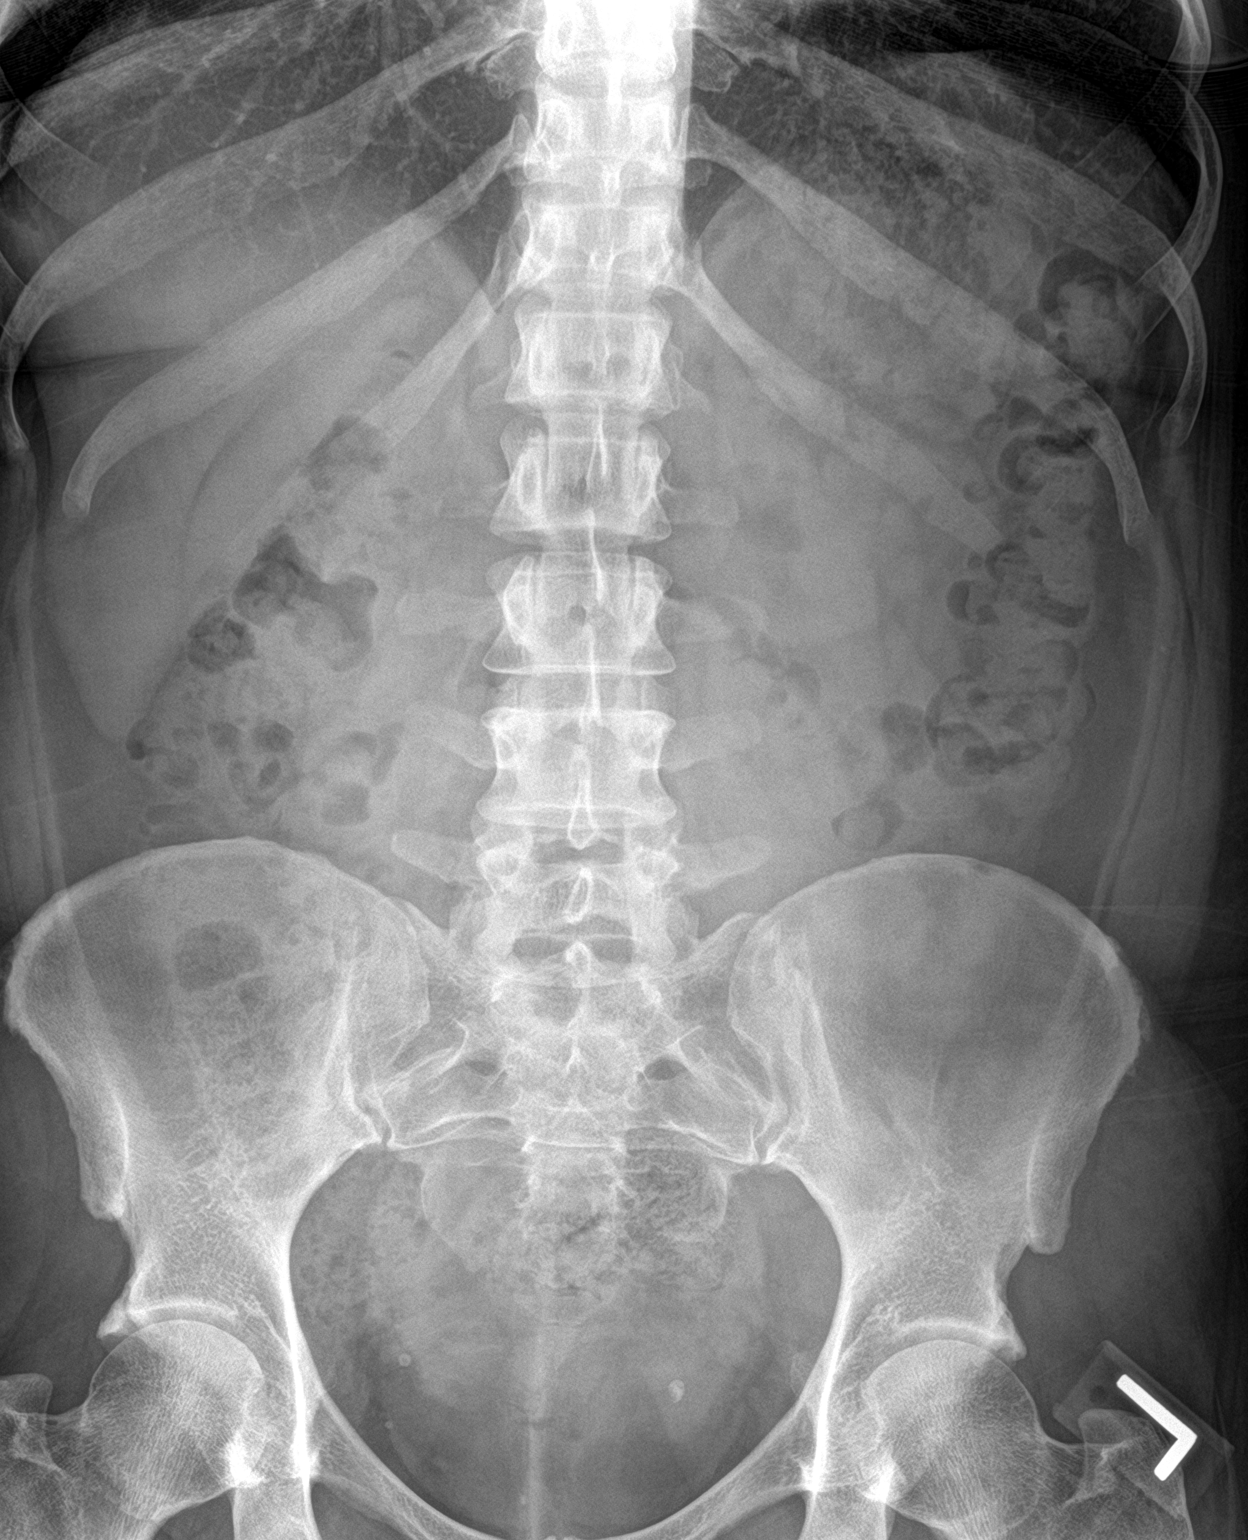

[1 of 1 positions shown; findings below may reference images not displayed]

FINDINGS: Nonobstructive bowel gas pattern. Small to moderate amount of stool
in the abdomen and pelvis. Calcifications in the pelvis are
suggestive for phleboliths. Bony structures are intact.
IMPRESSION: No acute abnormality.

## 2018-12-18 ENCOUNTER — Other Ambulatory Visit: Payer: Self-pay

## 2018-12-18 ENCOUNTER — Ambulatory Visit (INDEPENDENT_AMBULATORY_CARE_PROVIDER_SITE_OTHER): Payer: 59 | Admitting: Physician Assistant

## 2018-12-18 VITALS — BP 133/67 | HR 67 | Ht 64.0 in | Wt 188.0 lb

## 2018-12-18 DIAGNOSIS — Z131 Encounter for screening for diabetes mellitus: Secondary | ICD-10-CM

## 2018-12-18 DIAGNOSIS — Z0000 Encounter for general adult medical examination without abnormal findings: Secondary | ICD-10-CM

## 2018-12-18 DIAGNOSIS — Z1231 Encounter for screening mammogram for malignant neoplasm of breast: Secondary | ICD-10-CM

## 2018-12-18 DIAGNOSIS — Z13220 Encounter for screening for lipoid disorders: Secondary | ICD-10-CM

## 2018-12-18 DIAGNOSIS — Z6832 Body mass index (BMI) 32.0-32.9, adult: Secondary | ICD-10-CM

## 2018-12-18 DIAGNOSIS — E6609 Other obesity due to excess calories: Secondary | ICD-10-CM

## 2018-12-18 DIAGNOSIS — Z113 Encounter for screening for infections with a predominantly sexual mode of transmission: Secondary | ICD-10-CM

## 2018-12-18 MED ORDER — PHENTERMINE HCL 37.5 MG PO TABS: 37.5000 mg | ORAL_TABLET | Freq: Every day | ORAL | 0 refills | Status: DC

## 2018-12-18 NOTE — Progress Notes (Signed)
Subjective:     Julie Reese is a 43 y.o. female and is here for a comprehensive physical exam. The patient reports problems - she would like to talk about weight. .  Her mother died unexpectedly in the spring. She denies any excessive grief. She feels like she is handling it pretty well.   Social History   Socioeconomic History  . Marital status: Single    Spouse name: Not on file  . Number of children: Not on file  . Years of education: Not on file  . Highest education level: Not on file  Occupational History  . Not on file  Social Needs  . Financial resource strain: Not on file  . Food insecurity    Worry: Not on file    Inability: Not on file  . Transportation needs    Medical: Not on file    Non-medical: Not on file  Tobacco Use  . Smoking status: Never Smoker  . Smokeless tobacco: Never Used  Substance and Sexual Activity  . Alcohol use: Yes  . Drug use: No  . Sexual activity: Not Currently  Lifestyle  . Physical activity    Days per week: Not on file    Minutes per session: Not on file  . Stress: Not on file  Relationships  . Social Herbalist on phone: Not on file    Gets together: Not on file    Attends religious service: Not on file    Active member of club or organization: Not on file    Attends meetings of clubs or organizations: Not on file    Relationship status: Not on file  . Intimate partner violence    Fear of current or ex partner: Not on file    Emotionally abused: Not on file    Physically abused: Not on file    Forced sexual activity: Not on file  Other Topics Concern  . Not on file  Social History Narrative  . Not on file   Health Maintenance  Topic Date Due  . HIV Screening  02/06/1993  . PAP SMEAR-Modifier  01/31/2022  . TETANUS/TDAP  05/30/2026  . INFLUENZA VACCINE  Discontinued    The following portions of the patient's history were reviewed and updated as appropriate: allergies, current medications, past family  history, past medical history, past social history, past surgical history and problem list.  Review of Systems A comprehensive review of systems was negative.   Objective:    BP 133/67   Pulse 67   Ht 5\' 4"  (1.626 m)   Wt 188 lb (85.3 kg)   SpO2 100%   BMI 32.27 kg/m  General appearance: alert, cooperative, appears stated age and mildly obese Head: Normocephalic, without obvious abnormality, atraumatic Eyes: conjunctivae/corneas clear. PERRL, EOM's intact. Fundi benign. Ears: normal TM's and external ear canals both ears Nose: Nares normal. Septum midline. Mucosa normal. No drainage or sinus tenderness. Throat: lips, mucosa, and tongue normal; teeth and gums normal Neck: no adenopathy, no carotid bruit, no JVD, supple, symmetrical, trachea midline and thyroid not enlarged, symmetric, no tenderness/mass/nodules Back: symmetric, no curvature. ROM normal. No CVA tenderness. Lungs: clear to auscultation bilaterally Heart: regular rate and rhythm, S1, S2 normal, no murmur, click, rub or gallop Abdomen: soft, non-tender; bowel sounds normal; no masses,  no organomegaly Extremities: extremities normal, atraumatic, no cyanosis or edema Pulses: 2+ and symmetric Skin: Skin color, texture, turgor normal. No rashes or lesions Lymph nodes: Cervical, supraclavicular, and axillary  nodes normal. Neurologic: Alert and oriented X 3, normal strength and tone. Normal symmetric reflexes. Normal coordination and gait    Assessment:    Healthy female exam.      Plan:      Marland KitchenMarland KitchenAlainah was seen today for annual exam.  Diagnoses and all orders for this visit:  Routine physical examination -     Lipid Panel w/reflex Direct LDL -     COMPLETE METABOLIC PANEL WITH GFR -     MM 3D SCREEN BREAST BILATERAL -     TSH -     VITAMIN D 25 Hydroxy (Vit-D Deficiency, Fractures) -     B12 and Folate Panel -     HIV antibody (with reflex) -     RPR  Screening for diabetes mellitus -     COMPLETE  METABOLIC PANEL WITH GFR  Screening for lipid disorders -     Lipid Panel w/reflex Direct LDL  Visit for screening mammogram -     MM 3D SCREEN BREAST BILATERAL  Screening examination for STD (sexually transmitted disease) -     HIV antibody (with reflex) -     RPR -     C. trachomatis/N. gonorrhoeae RNA  Class 1 obesity due to excess calories without serious comorbidity with body mass index (BMI) of 32.0 to 32.9 in adult -     phentermine (ADIPEX-P) 37.5 MG tablet; Take 1 tablet (37.5 mg total) by mouth daily before breakfast.   .. Depression screen [facility] 2/9 12/18/2018 06/01/2017 06/27/2016  Decreased Interest 0 0 0  Down, Depressed, Hopeless 0 0 0  PHQ - 2 Score 0 0 0  Altered sleeping 0 - -  Tired, decreased energy 0 - -  Change in appetite 0 - -  Feeling bad or failure about yourself  0 - -  Trouble concentrating 0 - -  Moving slowly or fidgety/restless 0 - -  Suicidal thoughts 0 - -  PHQ-9 Score 0 - -  Difficult doing work/chores Not difficult at all - -   .Marland Kitchen Discussed 150 minutes of exercise a week.  Encouraged vitamin D 1000 units and Calcium 1300mg  or 4 servings of dairy a day.  Pap UTD. Mammogram ordered.  STD screening done today. Pt uses condoms.  Flu shot given today.  Labs ordered.   Weight increased from 164 to 188.  Pt did well on phentermine in the past. Restarted today. Marland Kitchen.Discussed low carb diet with 1500 calories and 80g of protein.  Exercising at least 150 minutes a week.  My Fitness Pal could be a Microbiologist.  Follow up in 1 month.    See After Visit Summary for Counseling Recommendations

## 2018-12-18 NOTE — Patient Instructions (Signed)
Health Maintenance, Female Adopting a healthy lifestyle and getting preventive care are important in promoting health and wellness. Ask your health care provider about:  The right schedule for you to have regular tests and exams.  Things you can do on your own to prevent diseases and keep yourself healthy. What should I know about diet, weight, and exercise? Eat a healthy diet   Eat a diet that includes plenty of vegetables, fruits, low-fat dairy products, and lean protein.  Do not eat a lot of foods that are high in solid fats, added sugars, or sodium. Maintain a healthy weight Body mass index (BMI) is used to identify weight problems. It estimates body fat based on height and weight. Your health care provider can help determine your BMI and help you achieve or maintain a healthy weight. Get regular exercise Get regular exercise. This is one of the most important things you can do for your health. Most adults should:  Exercise for at least 150 minutes each week. The exercise should increase your heart rate and make you sweat (moderate-intensity exercise).  Do strengthening exercises at least twice a week. This is in addition to the moderate-intensity exercise.  Spend less time sitting. Even light physical activity can be beneficial. Watch cholesterol and blood lipids Have your blood tested for lipids and cholesterol at 41 years of age, then have this test every 5 years. Have your cholesterol levels checked more often if:  Your lipid or cholesterol levels are high.  You are older than 40 years of age.  You are at high risk for heart disease. What should I know about cancer screening? Depending on your health history and family history, you may need to have cancer screening at various ages. This may include screening for:  Breast cancer.  Cervical cancer.  Colorectal cancer.  Skin cancer.  Lung cancer. What should I know about heart disease, diabetes, and high blood  pressure? Blood pressure and heart disease  High blood pressure causes heart disease and increases the risk of stroke. This is more likely to develop in people who have high blood pressure readings, are of African descent, or are overweight.  Have your blood pressure checked: ? Every 3-5 years if you are 18-39 years of age. ? Every year if you are 40 years old or older. Diabetes Have regular diabetes screenings. This checks your fasting blood sugar level. Have the screening done:  Once every three years after age 40 if you are at a normal weight and have a low risk for diabetes.  More often and at a younger age if you are overweight or have a high risk for diabetes. What should I know about preventing infection? Hepatitis B If you have a higher risk for hepatitis B, you should be screened for this virus. Talk with your health care provider to find out if you are at risk for hepatitis B infection. Hepatitis C Testing is recommended for:  Everyone born from 1945 through 1965.  Anyone with known risk factors for hepatitis C. Sexually transmitted infections (STIs)  Get screened for STIs, including gonorrhea and chlamydia, if: ? You are sexually active and are younger than 41 years of age. ? You are older than 41 years of age and your health care provider tells you that you are at risk for this type of infection. ? Your sexual activity has changed since you were last screened, and you are at increased risk for chlamydia or gonorrhea. Ask your health care provider if   you are at risk.  Ask your health care provider about whether you are at high risk for HIV. Your health care provider may recommend a prescription medicine to help prevent HIV infection. If you choose to take medicine to prevent HIV, you should first get tested for HIV. You should then be tested every 3 months for as long as you are taking the medicine. Pregnancy  If you are about to stop having your period (premenopausal) and  you may become pregnant, seek counseling before you get pregnant.  Take 400 to 800 micrograms (mcg) of folic acid every day if you become pregnant.  Ask for birth control (contraception) if you want to prevent pregnancy. Osteoporosis and menopause Osteoporosis is a disease in which the bones lose minerals and strength with aging. This can result in bone fractures. If you are 65 years old or older, or if you are at risk for osteoporosis and fractures, ask your health care provider if you should:  Be screened for bone loss.  Take a calcium or vitamin D supplement to lower your risk of fractures.  Be given hormone replacement therapy (HRT) to treat symptoms of menopause. Follow these instructions at home: Lifestyle  Do not use any products that contain nicotine or tobacco, such as cigarettes, e-cigarettes, and chewing tobacco. If you need help quitting, ask your health care provider.  Do not use street drugs.  Do not share needles.  Ask your health care provider for help if you need support or information about quitting drugs. Alcohol use  Do not drink alcohol if: ? Your health care provider tells you not to drink. ? You are pregnant, may be pregnant, or are planning to become pregnant.  If you drink alcohol: ? Limit how much you use to 0-1 drink a day. ? Limit intake if you are breastfeeding.  Be aware of how much alcohol is in your drink. In the [place] one drink equals one 12 oz bottle of beer (355 mL), one 5 oz glass of wine (148 mL), or one 1 oz glass of hard liquor (44 mL). General instructions  Schedule regular health, dental, and eye exams.  Stay current with your vaccines.  Tell your health care provider if: ? You often feel depressed. ? You have ever been abused or do not feel safe at home. Summary  Adopting a healthy lifestyle and getting preventive care are important in promoting health and wellness.  Follow your health care provider's instructions about healthy  diet, exercising, and getting tested or screened for diseases.  Follow your health care provider's instructions on monitoring your cholesterol and blood pressure. This information is not intended to replace advice given to you by your health care provider. Make sure you discuss any questions you have with your health care provider. Document Released: 10/10/2010 Document Revised: 03/20/2018 Document Reviewed: 03/20/2018 Elsevier Patient Education  2020 [employer]  

## 2018-12-19 ENCOUNTER — Encounter: Payer: Self-pay | Admitting: Physician Assistant

## 2018-12-19 DIAGNOSIS — E6609 Other obesity due to excess calories: Secondary | ICD-10-CM | POA: Insufficient documentation

## 2018-12-19 LAB — LIPID PANEL W/REFLEX DIRECT LDL
Cholesterol: 186 mg/dL (ref <200)
HDL: 36 mg/dL — ABNORMAL LOW (ref >=50)
LDL Cholesterol (Calc): 133 mg/dL (calc) — ABNORMAL HIGH
Non-HDL Cholesterol (Calc): 150 mg/dL (calc) — ABNORMAL HIGH (ref <130)
Total CHOL/HDL Ratio: 5.2 (calc) — ABNORMAL HIGH (ref <5.0)
Triglycerides: 77 mg/dL (ref <150)

## 2018-12-19 LAB — COMPLETE METABOLIC PANEL WITH GFR
AG Ratio: 1.6 (calc) (ref 1.0–2.5)
ALT: 13 U/L (ref 6–29)
AST: 12 U/L (ref 10–30)
Albumin: 4.4 g/dL (ref 3.6–5.1)
Alkaline phosphatase (APISO): 46 U/L (ref 31–125)
BUN: 7 mg/dL (ref 7–25)
CO2: 25 mmol/L (ref 20–32)
Calcium: 9.3 mg/dL (ref 8.6–10.2)
Chloride: 106 mmol/L (ref 98–110)
Creat: 0.86 mg/dL (ref 0.50–1.10)
GFR, Est African American: 98 mL/min/{1.73_m2} (ref >=60)
GFR, Est Non African American: 84 mL/min/{1.73_m2} (ref >=60)
Globulin: 2.7 g/dL (calc) (ref 1.9–3.7)
Glucose, Bld: 87 mg/dL (ref 65–99)
Potassium: 3.8 mmol/L (ref 3.5–5.3)
Sodium: 140 mmol/L (ref 135–146)
Total Bilirubin: 1.2 mg/dL (ref 0.2–1.2)
Total Protein: 7.1 g/dL (ref 6.1–8.1)

## 2018-12-19 LAB — B12 AND FOLATE PANEL
Folate: >24 ng/mL
Vitamin B-12: 607 pg/mL (ref 200–1100)

## 2018-12-19 LAB — RPR: RPR Ser Ql: NONREACTIVE

## 2018-12-19 LAB — TSH: TSH: 1.01 mIU/L

## 2018-12-19 LAB — HIV ANTIBODY (ROUTINE TESTING W REFLEX): HIV 1&2 Ab, 4th Generation: NONREACTIVE

## 2018-12-19 LAB — VITAMIN D 25 HYDROXY (VIT D DEFICIENCY, FRACTURES): Vit D, 25-Hydroxy: 22 ng/mL — ABNORMAL LOW (ref 30–100)

## 2018-12-20 NOTE — Progress Notes (Signed)
HDL low and LDL elevated. Working on diet/weight, more plants-less meats, less fried and processed foods. BETTER than 1 year ago. Over CV risk still low.   Kidney, liver, glucose look great.   b12 great.   Vitamin D low. Need at least 1000 units daily.   HIV and RPR negative.

## 2018-12-21 LAB — C. TRACHOMATIS/N. GONORRHOEAE RNA
C. trachomatis RNA, TMA: DETECTED — AB
N. gonorrhoeae RNA, TMA: NOT DETECTED

## 2018-12-23 ENCOUNTER — Other Ambulatory Visit: Payer: Self-pay | Admitting: Physician Assistant

## 2018-12-23 DIAGNOSIS — A749 Chlamydial infection, unspecified: Secondary | ICD-10-CM

## 2018-12-23 MED ORDER — DOXYCYCLINE HYCLATE 100 MG PO TABS: 100.0000 mg | ORAL_TABLET | Freq: Two times a day (BID) | ORAL | 0 refills | Status: DC

## 2018-12-23 NOTE — Progress Notes (Signed)
Lab ordered for recheck

## 2018-12-23 NOTE — Addendum Note (Signed)
Addended by: Donella Stade on: 12/23/2018 07:59 AM   Modules accepted: Orders

## 2018-12-23 NOTE — Progress Notes (Signed)
Please alert patient she does have chlamydia. I will send over antibiotic to treat. She does need to refrain from sexual activity for 7 days and alert sexual partners to be tested. Come back in one month to confirm chlamydia is cleared. Continue to use condoms in the future to help with STD protection.

## 2018-12-25 ENCOUNTER — Encounter: Payer: Self-pay | Admitting: Physician Assistant

## 2018-12-25 DIAGNOSIS — A749 Chlamydial infection, unspecified: Secondary | ICD-10-CM

## 2018-12-25 MED ORDER — AZITHROMYCIN 1 G PO PACK: 1.0000 g | PACK | Freq: Once | ORAL | 0 refills | Status: AC

## 2019-01-07 ENCOUNTER — Encounter: Payer: Self-pay | Admitting: Physician Assistant

## 2019-01-09 ENCOUNTER — Ambulatory Visit: Payer: 59

## 2019-01-23 ENCOUNTER — Ambulatory Visit: Payer: 59

## 2019-01-23 ENCOUNTER — Telehealth: Payer: Self-pay | Admitting: Physician Assistant

## 2019-01-23 NOTE — Telephone Encounter (Signed)
PT requested medication refill on phentramine. She has a follow up on 02/07/19. Please advise.

## 2019-01-23 NOTE — Telephone Encounter (Signed)
Pt advised she must wait until appointment with PCP. Verbalized understanding

## 2019-01-30 ENCOUNTER — Other Ambulatory Visit: Payer: Self-pay

## 2019-01-30 ENCOUNTER — Ambulatory Visit (INDEPENDENT_AMBULATORY_CARE_PROVIDER_SITE_OTHER): Payer: 59

## 2019-01-30 DIAGNOSIS — Z1231 Encounter for screening mammogram for malignant neoplasm of breast: Secondary | ICD-10-CM | POA: Diagnosis not present

## 2019-01-30 DIAGNOSIS — Z0000 Encounter for general adult medical examination without abnormal findings: Secondary | ICD-10-CM | POA: Diagnosis not present

## 2019-02-03 ENCOUNTER — Encounter: Payer: Self-pay | Admitting: Physician Assistant

## 2019-02-03 ENCOUNTER — Other Ambulatory Visit: Payer: Self-pay | Admitting: Physician Assistant

## 2019-02-03 DIAGNOSIS — N6320 Unspecified lump in the left breast, unspecified quadrant: Secondary | ICD-10-CM | POA: Insufficient documentation

## 2019-02-03 DIAGNOSIS — R928 Other abnormal and inconclusive findings on diagnostic imaging of breast: Secondary | ICD-10-CM

## 2019-02-03 DIAGNOSIS — N6310 Unspecified lump in the right breast, unspecified quadrant: Secondary | ICD-10-CM | POA: Insufficient documentation

## 2019-02-03 NOTE — Progress Notes (Signed)
Priscillia,   This is just not the year for you. Mammogram  did show possible right breast mass and need for more imaging. Images should be already ordered. Let me know if I can help you in any way.   Luvenia Starch

## 2019-02-05 ENCOUNTER — Encounter: Payer: Self-pay | Admitting: Physician Assistant

## 2019-02-05 ENCOUNTER — Ambulatory Visit
Admission: RE | Admit: 2019-02-05 | Discharge: 2019-02-05 | Disposition: A | Discharge status: Home / Self Care | Payer: 59 | Source: Ambulatory Visit | Attending: Physician Assistant | Admitting: Physician Assistant

## 2019-02-05 ENCOUNTER — Other Ambulatory Visit: Payer: Self-pay

## 2019-02-05 DIAGNOSIS — R928 Other abnormal and inconclusive findings on diagnostic imaging of breast: Secondary | ICD-10-CM

## 2019-02-05 DIAGNOSIS — N6001 Solitary cyst of right breast: Secondary | ICD-10-CM | POA: Insufficient documentation

## 2019-02-05 DIAGNOSIS — N6002 Solitary cyst of left breast: Secondary | ICD-10-CM | POA: Insufficient documentation

## 2019-02-05 NOTE — Progress Notes (Signed)
Yay! Good news. Multiple bilateral benign cyst. Screening mammogram in one year.

## 2019-02-07 ENCOUNTER — Ambulatory Visit: Payer: 59 | Admitting: Physician Assistant

## 2019-02-07 ENCOUNTER — Encounter: Payer: Self-pay | Admitting: Physician Assistant

## 2019-02-07 ENCOUNTER — Other Ambulatory Visit: Payer: Self-pay

## 2019-02-07 ENCOUNTER — Ambulatory Visit (INDEPENDENT_AMBULATORY_CARE_PROVIDER_SITE_OTHER): Payer: 59 | Admitting: Physician Assistant

## 2019-02-07 VITALS — BP 123/69 | HR 96 | Ht 63.0 in | Wt 180.0 lb

## 2019-02-07 DIAGNOSIS — E6609 Other obesity due to excess calories: Secondary | ICD-10-CM

## 2019-02-07 DIAGNOSIS — Z6832 Body mass index (BMI) 32.0-32.9, adult: Secondary | ICD-10-CM | POA: Diagnosis not present

## 2019-02-07 DIAGNOSIS — Z8619 Personal history of other infectious and parasitic diseases: Secondary | ICD-10-CM | POA: Diagnosis not present

## 2019-02-07 MED ORDER — PHENTERMINE HCL 37.5 MG PO TABS: 37.5000 mg | ORAL_TABLET | Freq: Every day | ORAL | 0 refills | Status: DC

## 2019-02-07 NOTE — Progress Notes (Signed)
   Subjective:    Patient ID: Julie Reese, female    DOB: September 25, 1975, 43 y.o.   MRN: [id number]  HPI  Pt is a 43 yo female who presents to the clinic for 1 month follow up for weight loss and Chlamydia infection clearance.   Pt is doing great. She has lost 8lbs on phentermine. She denies any increase in anxiety, palpitations, headaches. She is exercising more doing interval training and walking. She is watching her portions. She would like to continue on it.   Pts boyfriend tested negative for chlamydia. She finished her treatment. She is here to make sure negative. No vaginal discharge, irritation, pain.   .. Active Ambulatory Problems    Diagnosis Date Noted  . Obesity (BMI 30.0-34.9) 05/31/2016  . RUQ pain 05/31/2016  . Hyperlipidemia 06/05/2016  . Ganglion cyst of right foot 06/27/2016  . Post-viral cough syndrome 06/27/2016  . 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 Patellar tendonitis of left knee 10/18/2016  . 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 Dyslipidemia 06/04/2017  . Class 1 obesity due to excess calories without serious comorbidity with body mass index (BMI) of 32.0 to 32.9 in adult 12/19/2018  . Breast mass, right 02/03/2019  . Left breast mass 02/03/2019  . Bilateral breast cysts 02/05/2019   Resolved Ambulatory Problems    Diagnosis Date Noted  . No Resolved Ambulatory Problems   No Additional Past Medical History       Review of Systems  All other systems reviewed and are negative.      Objective:   Physical Exam Vitals signs reviewed.  Constitutional:      Appearance: Normal appearance.  Cardiovascular:     Rate and Rhythm: Normal rate and regular rhythm.  Pulmonary:     Effort: Pulmonary effort is normal.  Abdominal:     General: There is no distension.     Palpations: Abdomen is soft.     Tenderness: There is no abdominal tenderness. There is no guarding.  Neurological:     General: No focal deficit present.     Mental  Status: She is alert.  Psychiatric:        Mood and Affect: Mood normal.           Assessment & Plan:  Marland KitchenMarland KitchenCidnee was seen today for weight check.  Diagnoses and all orders for this visit:  Class 1 obesity due to excess calories without serious comorbidity with body mass index (BMI) of 32.0 to 32.9 in adult -     phentermine (ADIPEX-P) 37.5 MG tablet; Take 1 tablet (37.5 mg total) by mouth daily before breakfast.  History of chlamydia infection -     Cancel: SureSwab, Vaginosis/Vaginitis Plus -     C. trachomatis/N. gonorrhoeae RNA   Refilled phentermine for 3 months. Vitals great and no SE to medication.  Encouraged increasing exercising and adding healthy diet.   Ordered GC/chlamydia testing for clearance.

## 2019-02-08 LAB — C. TRACHOMATIS/N. GONORRHOEAE RNA
C. trachomatis RNA, TMA: NOT DETECTED
N. gonorrhoeae RNA, TMA: NOT DETECTED

## 2019-02-10 NOTE — Progress Notes (Signed)
Confirmed that Chlamydia infection has cleared.

## 2019-04-19 ENCOUNTER — Encounter: Payer: Self-pay | Admitting: Physician Assistant

## 2019-04-22 ENCOUNTER — Ambulatory Visit (INDEPENDENT_AMBULATORY_CARE_PROVIDER_SITE_OTHER): Payer: 59 | Admitting: Physician Assistant

## 2019-04-22 ENCOUNTER — Encounter: Payer: Self-pay | Admitting: Physician Assistant

## 2019-04-22 ENCOUNTER — Other Ambulatory Visit: Payer: Self-pay

## 2019-04-22 VITALS — Ht 63.0 in | Wt 171.0 lb

## 2019-04-22 DIAGNOSIS — N926 Irregular menstruation, unspecified: Secondary | ICD-10-CM

## 2019-04-22 DIAGNOSIS — Z3201 Encounter for pregnancy test, result positive: Secondary | ICD-10-CM

## 2019-04-22 NOTE — Progress Notes (Signed)
Has taken home pregnancy test - positive (Saturday) Last menstrual period 03/13/2019 Wants to discuss options

## 2019-04-22 NOTE — Progress Notes (Addendum)
Patient ID: Julie Reese, female   DOB: 08-Jul-1975, 44 y.o.   MRN: [id number] .Marland KitchenVirtual Visit via Video Note  I connected with Julie Reese on 04/22/19 at  3:00 PM EST by a video enabled telemedicine application and verified that I am speaking with the correct person using two identifiers.  Location: Patient: home Provider: clinic   I discussed the limitations of evaluation and management by telemedicine and the availability of in person appointments. The patient expressed understanding and agreed to proceed.  History of Present Illness: Patient is a 44 year old female who calls into the clinic after a missed period and positive home pregnancy test.  She just recently got engaged and celebrated without using condoms.  Her last period was 03/13/2019.  She has taken home pregnancy test on Saturday and was positive.  She does admit to feeling a little nauseated.  She has one 56 year old daughter.  She was not trying to get pregnant.  She has been intermittently on phentermine for weight loss.  She wonders if she can stay on this. Pt denies any abdominal cramping or vaginal bleeding. No pregnancy issues with previous child. She is very anxious about her own health being pregnant.  .. Active Ambulatory Problems    Diagnosis Date Noted  . Obesity (BMI 30.0-34.9) 05/31/2016  . RUQ pain 05/31/2016  . Hyperlipidemia 06/05/2016  . Ganglion cyst of right foot 06/27/2016  . Post-viral cough syndrome 06/27/2016  . 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 Patellar tendonitis of left knee 10/18/2016  . 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 Dyslipidemia 06/04/2017  . Class 1 obesity due to excess calories without serious comorbidity with body mass index (BMI) of 32.0 to 32.9 in adult 12/19/2018  . Breast mass, right 02/03/2019  . Left breast mass 02/03/2019  . Bilateral breast cysts 02/05/2019   Resolved Ambulatory Problems    Diagnosis Date Noted  . No Resolved Ambulatory  Problems   No Additional Past Medical History   Reviewed med, allergy, problem list.   Observations/Objective: NO acute distress.  Normal mood and appearance.   .. Today's Vitals   04/22/19 1437  Weight: 171 lb (77.6 kg)  Height: 5\' 3"  (1.6 m)   Body mass index is 30.29 kg/m.     Assessment and Plan: Marland KitchenMarland KitchenFlorice was seen today for possible pregnancy.  Diagnoses and all orders for this visit:  Missed period -     B-HCG Quant -     Ambulatory referral to Obstetrics / Gynecology  Positive pregnancy test -     Ambulatory referral to Obstetrics / Gynecology   Patient has had a missed.  And a positive home pregnancy test.  I do think this warrants a serum pregnancy test.  Based on her last.  She should be about 6 weeks.  She needs to follow-up with OB in the next 2 weeks.  Instructed her not to take any more phentermine.  Discussed only Tylenol for aches and pains.  Avoid any NSAIDs.  Patient was instructed to get a prenatal vitamin with DHA and folic acid.  Referral made to high risk pregnancy OB.  Briefly discussed some of the first pregnancy symptoms and most notably nausea.  Symptomatic/conservative treatment discussed. Estimated due date September 9th.   Spent 18 minutes with patient and in chart review.    Follow Up Instructions:    I discussed the assessment and treatment plan with the patient. The patient was provided an opportunity to ask questions  and all were answered. The patient agreed with the plan and demonstrated an understanding of the instructions.   The patient was advised to call back or seek an in-person evaluation if the symptoms worsen or if the condition fails to improve as anticipated.    Iran Planas, PA-C

## 2019-04-22 NOTE — Patient Instructions (Signed)
Pregnancy After Age 42 Women who become pregnant after the age of 20 have a higher risk for certain problems during pregnancy. This is because older women may already have health problems before becoming pregnant. Older women who are healthy before pregnancy may still develop problems during pregnancy. These problems may affect the mother, the unborn baby (fetus), or both. What are the risks for me? If you are over age 15 and you want to become pregnant or are pregnant, you may have a higher risk of:  Not being able to get pregnant (infertility).  Going into labor early (preterm labor).  Needing surgical delivery of your baby (cesarean delivery, or C-section).  Having high blood pressure (hypertension).  Having complications during pregnancy, such as high blood pressure and other symptoms (preeclampsia).  Having diabetes during pregnancy (gestational diabetes).  Being pregnant with more than one baby.  Loss of the unborn baby before 20 weeks (miscarriage) or after 20 weeks of pregnancy (stillbirth). What are the risks for my baby? Babies born to women over the age of 21 have a higher risk for:  Being born early (prematurity).  Low birth weight, which is less than 5 lb, 8 oz (2.5 kg).  Birth defects, such as Down syndrome and cleft palate.  Health complications, including problems with growth and development. How is prenatal care different for women over age 59? All women should see their health care provider before they try to become pregnant. This is especially important for women over the age of 7. Tell your health care provider about:  Any health problems you have.  Any medicines you take.  Any family history of health problems or chromosome-related defects.  Any problems you have had with past pregnancies or deliveries. If you are over age 45 and you plan to become pregnant:  Start taking a daily multivitamin a month or more before you try to get pregnant. Your  multivitamin should contain 400 mcg (micrograms) of folic acid. If you are over age 24 and pregnant, make sure you:  Keep taking your multivitamin unless your health care provider tells you not to take it.  Keep all prenatal visits as told by your health care provider. This is important.  Have ultrasounds regularly throughout your pregnancy to check for problems.  Talk with your health care provider about other prenatal screening tests that you may need. What additional prenatal tests are needed? Screening tests show whether your baby has a higher risk for birth defects than other babies. Screening tests include:  Ultrasound tests to look for markers that indicate a risk for birth defects.  Maternal blood screening. These are blood tests that measure certain substances in your blood to determine your baby's risk for defects. Screening tests do not show whether your baby has or does not have defects. They only show your baby's risk for certain defects. If your screening tests show that risk factors are present, you may need tests to confirm the defect (diagnostic testing). These tests may include:  Chorionic villus sampling. For this procedure, a tissue sample is taken from the organ that forms in your uterus to nourish your baby (placenta). The sample is removed through your cervix or abdomen and tested.  Amniocentesis. For this procedure, a small amount of the fluid that surrounds the baby in the uterus (amniotic fluid) is removed and tested. What can I do to stay healthy during my pregnancy? Staying healthy during pregnancy can help you and your baby to have a lower risk for  problems during pregnancy, during delivery, or both. Talk with your health care provider for specific instructions about staying healthy during your pregnancy. Nutrition   At each meal, eat a variety of foods from each of the five food groups. These groups include: ? Proteins such as lean meats, poultry, fish that is  low in fat, beans, eggs, and nuts. ? Vegetables such as leafy greens, raw and cooked vegetables, and vegetable juice. ? Fruits that are fresh, frozen, or canned, or 100% fruit juice. ? Dairy products such as low-fat yogurt, cheese, and milk. ? Whole grains including rice, cereal, pasta, and bread.  Talk with your health care provider about how much food in each group is right for you.  Follow instructions from your health care provider about eating and drinking restrictions during pregnancy. ? Do not eat raw eggs, raw meat, or raw fish or seafood. ? Do not eat any fish that contains high amounts of mercury, such as swordfish or mackerel.  Drink 6-8 or more glasses of water a day. You should drink enough fluid to keep your urine pale yellow. Managing weight gain  Ask your health care provider how much weight gain is healthy during pregnancy.  Stay at a healthy weight. If needed, work with your health care provider to lose weight safely. Activity  Exercise regularly, as directed by your health care provider. Ask your health care provider what forms of exercise are safe for you. General instructions  Do not use any products that contain nicotine or tobacco, such as cigarettes and e-cigarettes. If you need help quitting, ask your health care provider.  Do not drink alcohol, use drugs, or abuse prescription medicine.  Take over-the-counter and prescription medicines only as told by your health care provider.  Do not use hot tubs, steam rooms, or saunas.  Talk with your health care provider about your risk of exposure to harmful environmental conditions. This includes exposure to chemicals, radiation, cleaning products, and cat feces. Follow advice from your health care provider about how to limit your exposure. Summary  Women who become pregnant after the age of 32 have a higher risk for complications during pregnancy.  Problems may affect the mother, the unborn baby (fetus), or  both.  All women should see their health care provider before they try to become pregnant. This is especially important for women over the age of 52.  Staying healthy during pregnancy can help both you and your baby to have a lower risk for some of the problems that can happen during pregnancy, during delivery, or both. This information is not intended to replace advice given to you by your health care provider. Make sure you discuss any questions you have with your health care provider. Document Revised: 07/19/2018 Document Reviewed: 07/17/2016 Elsevier Patient Education  [place].

## 2019-05-01 ENCOUNTER — Encounter: Payer: Self-pay | Admitting: Physician Assistant

## 2019-05-07 ENCOUNTER — Ambulatory Visit: Payer: 59 | Admitting: Physician Assistant

## 2019-05-09 ENCOUNTER — Ambulatory Visit: Payer: 59 | Admitting: Physician Assistant

## 2019-05-12 ENCOUNTER — Encounter: Payer: Self-pay | Admitting: Physician Assistant

## 2019-05-13 ENCOUNTER — Encounter: Payer: Self-pay | Admitting: Physician Assistant

## 2019-05-13 DIAGNOSIS — Z8759 Personal history of other complications of pregnancy, childbirth and the puerperium: Secondary | ICD-10-CM | POA: Insufficient documentation

## 2019-05-28 ENCOUNTER — Encounter: Payer: Self-pay | Admitting: Physician Assistant

## 2019-11-03 ENCOUNTER — Emergency Department (INDEPENDENT_AMBULATORY_CARE_PROVIDER_SITE_OTHER): Payer: 59

## 2019-11-03 ENCOUNTER — Emergency Department (INDEPENDENT_AMBULATORY_CARE_PROVIDER_SITE_OTHER)
Admission: EM | Admit: 2019-11-03 | Discharge: 2019-11-03 | Disposition: A | Discharge status: Home / Self Care | Payer: 59 | Source: Home / Self Care

## 2019-11-03 ENCOUNTER — Other Ambulatory Visit: Payer: Self-pay

## 2019-11-03 ENCOUNTER — Encounter: Payer: Self-pay | Admitting: Physician Assistant

## 2019-11-03 DIAGNOSIS — S060X0A Concussion without loss of consciousness, initial encounter: Secondary | ICD-10-CM

## 2019-11-03 DIAGNOSIS — S0990XA Unspecified injury of head, initial encounter: Secondary | ICD-10-CM | POA: Diagnosis not present

## 2019-11-03 DIAGNOSIS — W2209XA Striking against other stationary object, initial encounter: Secondary | ICD-10-CM | POA: Diagnosis not present

## 2019-11-03 NOTE — ED Triage Notes (Signed)
Patient presents to Urgent Care with complaints of head pain since yesterday. Patient reports she turned quickly and hit her head on her son's bunk bed. Pt states she did not pass out, but her vision did change slightly after hitting her head. Pt endorses feeling "out of it" and going "in and out of sleep".

## 2019-11-03 NOTE — Discharge Instructions (Signed)
  Call to schedule a follow up appointment with your primary care provider tomorrow or Wednesday for recheck of symptoms.  Call 911 or have someone drive you to the hospital if you develop worsening headache, change in vision, dizziness/passing out, weakness or numbness in arms or legs or other new concerning symptoms develop.

## 2019-11-03 NOTE — Telephone Encounter (Signed)
Patient did leave vm about this as well. Same information. Looks like she is going to urgent care for evaluation.

## 2019-11-03 NOTE — ED Provider Notes (Signed)
[facility]    CSN: [id number] Arrival date & time: 11/03/19  1401      History   Chief Complaint Chief Complaint  Patient presents with  . Head Injury    HPI ANISTON CHRISTMAN is a 44 y.o. female.   HPI VERLE BRILLHART is a 44 y.o. female presenting to UC with c/o continued headache and some fatigue after hitting her head on her son's bunkbed yesterday, no LOC.  Pt states she went to bed late and also had the police show up at her door because her security alarm was going off around 2AM. She is not sure if the head injury or lack police coming at 2AM is why she is so fatigued today.  Denies n/v/d. She is not on birth control. Denies change in vision right now but slight change immediately after hitting her head.    Past Medical History:  Diagnosis Date  . Hyperlipidemia 06/05/2016  . Obesity (BMI 30.0-34.9) 05/31/2016  . Pap smear of cervix shows high risk HPV present 06/28/2016   Last pap 01/2016 with one year follow up.     Patient Active Problem List   Diagnosis Date Noted  . History of miscarriage 05/13/2019  . Bilateral breast cysts 02/05/2019  . Breast mass, right 02/03/2019  . Left breast mass 02/03/2019  . Class 1 obesity due to excess calories without serious comorbidity with body mass index (BMI) of 32.0 to 32.9 in adult 12/19/2018  . Dyslipidemia 06/04/2017  . Patellar tendonitis of left knee 10/18/2016  . 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 Ganglion cyst of right foot 06/27/2016  . Post-viral cough syndrome 06/27/2016  . Hyperlipidemia 06/05/2016  . Obesity (BMI 30.0-34.9) 05/31/2016  . RUQ pain 05/31/2016    Past Surgical History:  Procedure Laterality Date  . WISDOM TOOTH EXTRACTION      OB History   No obstetric history on file.      Home Medications    Prior to Admission medications   Medication Sig Start Date End Date Taking? Authorizing Provider  Ascorbic Acid (VITAMIN  C DROPS MT) Use as directed in the mouth or throat.    [provider]  Multiple Vitamin (MULTIVITAMIN) capsule Take by mouth.    [provider]  Probiotic Product (PROBIOTIC-10 PO) Take by mouth.    [provider]    Family History Family History  Problem Relation Age of Onset  . Diabetes Mother   . Hypertension Mother   . Intracerebral hemorrhage Mother   . High Cholesterol Father   . Diabetes Maternal Grandmother   . Cancer Maternal Grandmother        breast cancer/double mascetomy.   . Diabetes Paternal Grandmother   . Breast cancer Paternal Grandmother     Social History Social History   Tobacco Use  . Smoking status: Never Smoker  . Smokeless tobacco: Never Used  Substance Use Topics  . Alcohol use: Yes    Comment: occ  . Drug use: No     Allergies   Doxycycline and Percocet [oxycodone-acetaminophen]   Review of Systems Review of Systems  Gastrointestinal: Negative for diarrhea, nausea and vomiting.  Neurological: Positive for headaches. Negative for dizziness, syncope and light-headedness.     Physical Exam Triage Vital Signs ED Triage Vitals  Enc Vitals Group     BP 11/03/19 1500 120/79     Pulse Rate 11/03/19 1500 66  Resp 11/03/19 1500 16     Temp 11/03/19 1500 99 F (37.2 C)     Temp Source 11/03/19 1500 Oral     SpO2 11/03/19 1500 99 %     Weight --      Height --      Head Circumference --      Peak Flow --      Pain Score 11/03/19 1458 6     Pain Loc --      Pain Edu? --      Excl. in [place]? --    No data found.  Updated Vital Signs BP 120/79 (BP Location: Right Arm)   Pulse 66   Temp 99 F (37.2 C) (Oral)   Resp 16   LMP 10/13/2019   SpO2 99%   Visual Acuity Right Eye Distance:   Left Eye Distance:   Bilateral Distance:    Right Eye Near:   Left Eye Near:    Bilateral Near:     Physical Exam Vitals and nursing note reviewed.  Constitutional:      General: She is not in acute distress.     Appearance: Normal appearance. She is well-developed. She is not ill-appearing, toxic-appearing or diaphoretic.  HENT:     Head: Normocephalic and atraumatic.      Right Ear: Tympanic membrane and ear canal normal.     Left Ear: Tympanic membrane and ear canal normal.     Nose: Nose normal.     Right Sinus: No maxillary sinus tenderness or frontal sinus tenderness.     Left Sinus: No maxillary sinus tenderness or frontal sinus tenderness.     Mouth/Throat:     Lips: Pink.     Mouth: Mucous membranes are moist.     Pharynx: Oropharynx is clear. Uvula midline.  Eyes:     Extraocular Movements: Extraocular movements intact.     Conjunctiva/sclera: Conjunctivae normal.     Pupils: Pupils are equal, round, and reactive to light.  Neck:     Comments: No midline bone tenderness, no crepitus or step-offs.  Cardiovascular:     Rate and Rhythm: Normal rate and regular rhythm.  Pulmonary:     Effort: Pulmonary effort is normal. No respiratory distress.     Breath sounds: Normal breath sounds.  Musculoskeletal:        General: Normal range of motion.     Cervical back: Normal range of motion and neck supple.  Skin:    General: Skin is warm and dry.  Neurological:     General: No focal deficit present.     Mental Status: She is alert and oriented to person, place, and time.     Cranial Nerves: No cranial nerve deficit.     Sensory: No sensory deficit.     Motor: No weakness.     Coordination: Coordination normal.     Gait: Gait normal.  Psychiatric:        Mood and Affect: Mood normal.        Behavior: Behavior normal.      UC Treatments / Results  Labs (all labs ordered are listed, but only abnormal results are displayed) Labs Reviewed - No data to display  EKG   Radiology CT Head Wo Contrast  Result Date: 11/03/2019 CLINICAL DATA:  Hit head on bed EXAM: CT HEAD WITHOUT CONTRAST TECHNIQUE: Contiguous axial images were obtained from the base of the skull through the vertex  without intravenous contrast. COMPARISON:  None. FINDINGS: Brain: There  is no acute intracranial hemorrhage, mass effect, or edema. Gray-white differentiation is preserved. There is no extra-axial fluid collection. Ventricles and sulci are within normal limits in size and configuration. Vascular: No hyperdense vessel or unexpected calcification. Skull: Calvarium is unremarkable. Sinuses/Orbits: No acute finding. Other: None. IMPRESSION: No evidence of acute intracranial injury. Electronically Signed   By: Macy Mis M.D.   On: 11/03/2019 15:58    Procedures Procedures (including critical care time)  Medications Ordered in UC Medications - No data to display  Initial Impression / Assessment and Plan / UC Course  I have reviewed the triage vital signs and the nursing notes.  Pertinent labs & imaging results that were available during my care of the patient were reviewed by me and considered in my medical decision making (see chart for details).     Reviewed CT with pt Normal neuro exam Encouraged close f/u with PCP or Sports Medicine. Discussed symptoms that warrant emergent care in the ED.  Final Clinical Impressions(s) / UC Diagnoses   Final diagnoses:  Concussion without loss of consciousness, initial encounter  Injury of head, initial encounter     Discharge Instructions      Call to schedule a follow up appointment with your primary care provider tomorrow or Wednesday for recheck of symptoms.  Call 911 or have someone drive you to the hospital if you develop worsening headache, change in vision, dizziness/passing out, weakness or numbness in arms or legs or other new concerning symptoms develop.     ED Prescriptions    None     PDMP not reviewed this encounter.   Noe Gens, [place] 11/05/19 1130

## 2019-12-29 ENCOUNTER — Encounter: Payer: Self-pay | Admitting: Physician Assistant

## 2019-12-29 NOTE — Telephone Encounter (Signed)
Ok to schedule virtual

## 2019-12-31 ENCOUNTER — Telehealth: Payer: 59 | Admitting: Physician Assistant

## 2020-06-29 ENCOUNTER — Ambulatory Visit: Payer: 59 | Admitting: Physician Assistant

## 2020-06-29 ENCOUNTER — Encounter: Payer: Self-pay | Admitting: Physician Assistant

## 2020-06-29 ENCOUNTER — Other Ambulatory Visit: Payer: Self-pay

## 2020-06-29 VITALS — BP 131/80 | HR 90 | Ht 63.0 in | Wt 211.0 lb

## 2020-06-29 DIAGNOSIS — F4321 Adjustment disorder with depressed mood: Secondary | ICD-10-CM

## 2020-06-29 DIAGNOSIS — Z131 Encounter for screening for diabetes mellitus: Secondary | ICD-10-CM

## 2020-06-29 DIAGNOSIS — J329 Chronic sinusitis, unspecified: Secondary | ICD-10-CM | POA: Diagnosis not present

## 2020-06-29 DIAGNOSIS — F419 Anxiety disorder, unspecified: Secondary | ICD-10-CM

## 2020-06-29 DIAGNOSIS — Z6832 Body mass index (BMI) 32.0-32.9, adult: Secondary | ICD-10-CM

## 2020-06-29 DIAGNOSIS — J40 Bronchitis, not specified as acute or chronic: Secondary | ICD-10-CM

## 2020-06-29 DIAGNOSIS — R4589 Other symptoms and signs involving emotional state: Secondary | ICD-10-CM

## 2020-06-29 DIAGNOSIS — E6609 Other obesity due to excess calories: Secondary | ICD-10-CM

## 2020-06-29 DIAGNOSIS — Z13220 Encounter for screening for lipoid disorders: Secondary | ICD-10-CM | POA: Diagnosis not present

## 2020-06-29 DIAGNOSIS — Z1329 Encounter for screening for other suspected endocrine disorder: Secondary | ICD-10-CM

## 2020-06-29 MED ORDER — AMOXICILLIN-POT CLAVULANATE 875-125 MG PO TABS: 1.0000 | ORAL_TABLET | Freq: Two times a day (BID) | ORAL | 0 refills | Status: AC

## 2020-06-29 MED ORDER — METHYLPREDNISOLONE 4 MG PO TBPK: ORAL_TABLET | ORAL | 0 refills | Status: DC

## 2020-06-29 MED ORDER — FLUTICASONE PROPIONATE 50 MCG/ACT NA SUSP: 2.0000 | Freq: Every day | NASAL | 2 refills | Status: DC

## 2020-06-29 MED ORDER — BUPROPION HCL ER (SR) 100 MG PO TB12: 100.0000 mg | ORAL_TABLET | Freq: Two times a day (BID) | ORAL | 2 refills | Status: DC

## 2020-06-29 NOTE — Patient Instructions (Signed)
I will make referral to counseling.  Start wellbutrin.   For sinuses: Start augmentin/medrol/flonase.    Sinusitis, Adult Sinusitis is soreness and swelling (inflammation) of your sinuses. Sinuses are hollow spaces in the bones around your face. They are located:  Around your eyes.  In the middle of your forehead.  Behind your nose.  In your cheekbones. Your sinuses and nasal passages are lined with a fluid called mucus. Mucus drains out of your sinuses. Swelling can trap mucus in your sinuses. This lets germs (bacteria, virus, or fungus) grow, which leads to infection. Most of the time, this condition is caused by a virus. What are the causes? This condition is caused by:  Allergies.  Asthma.  Germs.  Things that block your nose or sinuses.  Growths in the nose (nasal polyps).  Chemicals or irritants in the air.  Fungus (rare). What increases the risk? You are more likely to develop this condition if:  You have a weak body defense system (immune system).  You do a lot of swimming or diving.  You use nasal sprays too much.  You smoke. What are the signs or symptoms? The main symptoms of this condition are pain and a feeling of pressure around the sinuses. Other symptoms include:  Stuffy nose (congestion).  Runny nose (drainage).  Swelling and warmth in the sinuses.  Headache.  Toothache.  A cough that may get worse at night.  Mucus that collects in the throat or the back of the nose (postnasal drip).  Being unable to smell and taste.  Being very tired (fatigue).  A fever.  Sore throat.  Bad breath. How is this diagnosed? This condition is diagnosed based on:  Your symptoms.  Your medical history.  A physical exam.  Tests to find out if your condition is short-term (acute) or long-term (chronic). Your doctor may: ? Check your nose for growths (polyps). ? Check your sinuses using a tool that has a light (endoscope). ? Check for allergies  or germs. ? Do imaging tests, such as an MRI or CT scan. How is this treated? Treatment for this condition depends on the cause and whether it is short-term or long-term.  If caused by a virus, your symptoms should go away on their own within 10 days. You may be given medicines to relieve symptoms. They include: ? Medicines that shrink swollen tissue in the nose. ? Medicines that treat allergies (antihistamines). ? A spray that treats swelling of the nostrils. ? Rinses that help get rid of thick mucus in your nose (nasal saline washes).  If caused by bacteria, your doctor may wait to see if you will get better without treatment. You may be given antibiotic medicine if you have: ? A very bad infection. ? A weak body defense system.  If caused by growths in the nose, you may need to have surgery. Follow these instructions at home: Medicines  Take, use, or apply over-the-counter and prescription medicines only as told by your doctor. These may include nasal sprays.  If you were prescribed an antibiotic medicine, take it as told by your doctor. Do not stop taking the antibiotic even if you start to feel better. Hydrate and humidify  Drink enough water to keep your pee (urine) pale yellow.  Use a cool mist humidifier to keep the humidity level in your home above 50%.  Breathe in steam for 10-15 minutes, 3-4 times a day, or as told by your doctor. You can do this in the bathroom  while a hot shower is running.  Try not to spend time in cool or dry air.   Rest  Rest as much as you can.  Sleep with your head raised (elevated).  Make sure you get enough sleep each night. General instructions  Put a warm, moist washcloth on your face 3-4 times a day, or as often as told by your doctor. This will help with discomfort.  Wash your hands often with soap and water. If there is no soap and water, use hand sanitizer.  Do not smoke. Avoid being around people who are smoking (secondhand  smoke).  Keep all follow-up visits as told by your doctor. This is important.   Contact a doctor if:  You have a fever.  Your symptoms get worse.  Your symptoms do not get better within 10 days. Get help right away if:  You have a very bad headache.  You cannot stop throwing up (vomiting).  You have very bad pain or swelling around your face or eyes.  You have trouble seeing.  You feel confused.  Your neck is stiff.  You have trouble breathing. Summary  Sinusitis is swelling of your sinuses. Sinuses are hollow spaces in the bones around your face.  This condition is caused by tissues in your nose that become inflamed or swollen. This traps germs. These can lead to infection.  If you were prescribed an antibiotic medicine, take it as told by your doctor. Do not stop taking it even if you start to feel better.  Keep all follow-up visits as told by your doctor. This is important. This information is not intended to replace advice given to you by your health care provider. Make sure you discuss any questions you have with your health care provider. Document Revised: 08/27/2017 Document Reviewed: 08/27/2017 Elsevier Patient Education  2021 [employer].

## 2020-06-29 NOTE — Progress Notes (Signed)
Subjective:    Patient ID: Julie Reese, female    DOB: 27-Mar-1976, 45 y.o.   MRN: [id number]  HPI  Patient is a 45 year old obese female who presents to the clinic with multiple concerns today.  Her main concern is her sinus pressure, ear popping, sore throat, cough, chest congestion, headache which has been ongoing for about 2 weeks now.  She thought it was more URI's been treating with over-the-counter medications.  He has continued to progress.  She denies any significant shortness of breath, wheezing.  She denies any loss of smell or taste.  She has lost her voice.  She also presents to the clinic to touch base with her mood.  She does admit to having depressed mood and anxious thoughts.  She is working from home.  She has had a lot of triggers over the last 2 years.  Her mother passed away.  Her best friend passed away.  Her pastor passed away.  She had a miscarriage.  She is really struggling with motivation and hope. She fears this is effecting her relationship with her boyfriend. She has no desire to connect to him.   She is also pretty frustrated with her weight. She wants to get back to healthy weight and feel better about herself.   .. Active Ambulatory Problems    Diagnosis Date Noted  . Obesity (BMI 30.0-34.9) 05/31/2016  . RUQ pain 05/31/2016  . Hyperlipidemia 06/05/2016  . Ganglion cyst of right foot 06/27/2016  . Post-viral cough syndrome 06/27/2016  . 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 Patellar tendonitis of left knee 10/18/2016  . 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 Dyslipidemia 06/04/2017  . Class 1 obesity due to excess calories without serious comorbidity with body mass index (BMI) of 32.0 to 32.9 in adult 12/19/2018  . Breast mass, right 02/03/2019  . Left breast mass 02/03/2019  . Bilateral breast cysts 02/05/2019  . History of miscarriage 05/13/2019  . Anxiety 07/05/2020  . Depressed mood 07/05/2020   Resolved Ambulatory Problems     Diagnosis Date Noted  . No Resolved Ambulatory Problems   No Additional Past Medical History     Review of Systems See HPI.     Objective:   Physical Exam Vitals reviewed.  Constitutional:      Appearance: Normal appearance. She is obese.  HENT:     Head: Normocephalic.     Comments: Maxillary sinus tenderness to palpation.     Right Ear: Tympanic membrane normal.     Left Ear: Tympanic membrane normal.     Nose: Congestion present.     Mouth/Throat:     Mouth: Mucous membranes are moist.  Eyes:     Conjunctiva/sclera: Conjunctivae normal.  Neck:     Vascular: No carotid bruit.  Cardiovascular:     Rate and Rhythm: Normal rate and regular rhythm.     Pulses: Normal pulses.     Heart sounds: Normal heart sounds.  Pulmonary:     Effort: Pulmonary effort is normal.     Breath sounds: Normal breath sounds.     Comments: Productive cough. Musculoskeletal:     Right lower leg: No edema.     Left lower leg: No edema.  Lymphadenopathy:     Cervical: No cervical adenopathy.  Neurological:     General: No focal deficit present.     Mental Status: She is alert and oriented to person, place, and time.  Psychiatric:  Mood and Affect: Mood normal.        Behavior: Behavior normal.       .. Depression screen [facility] 2/9 06/29/2020 12/18/2018 06/01/2017 06/27/2016  Decreased Interest 3 0 0 0  Down, Depressed, Hopeless 1 0 0 0  PHQ - 2 Score 4 0 0 0  Altered sleeping 3 0 - -  Tired, decreased energy 1 0 - -  Change in appetite 1 0 - -  Feeling bad or failure about yourself  2 0 - -  Trouble concentrating 0 0 - -  Moving slowly or fidgety/restless 0 0 - -  Suicidal thoughts 0 0 - -  PHQ-9 Score 11 0 - -  Difficult doing work/chores Somewhat difficult Not difficult at all - -   .Marland Kitchen GAD 7 : Generalized Anxiety Score 06/29/2020 12/18/2018  Nervous, Anxious, on Edge 2 0  Control/stop worrying 2 0  Worry too much - different things 2 0  Trouble relaxing 1 0  Restless 0 0   Easily annoyed or irritable 1 0  Afraid - awful might happen 1 0  Total GAD 7 Score 9 0  Anxiety Difficulty Somewhat difficult Not difficult at all        Assessment & Plan:  Marland KitchenMarland KitchenChelsee was seen today for allergies and obesity.  Diagnoses and all orders for this visit:  Sinobronchitis -     amoxicillin-clavulanate (AUGMENTIN) 875-125 MG tablet; Take 1 tablet by mouth 2 (two) times daily for 10 days. -     methylPREDNISolone (MEDROL DOSEPAK) 4 MG TBPK tablet; Take as directed by package insert.  Class 1 obesity due to excess calories without serious comorbidity with body mass index (BMI) of 32.0 to 32.9 in adult -     CBC with Differential/Platelet -     COMPLETE METABOLIC PANEL WITH GFR -     Lipid Panel w/reflex Direct LDL -     TSH -     buPROPion (WELLBUTRIN SR) 100 MG 12 hr tablet; Take 1 tablet (100 mg total) by mouth 2 (two) times daily.  Screening for lipid disorders -     Lipid Panel w/reflex Direct LDL  Screening for diabetes mellitus -     COMPLETE METABOLIC PANEL WITH GFR  Thyroid disorder screen -     TSH  Depressed mood -     buPROPion (WELLBUTRIN SR) 100 MG 12 hr tablet; Take 1 tablet (100 mg total) by mouth 2 (two) times daily.  Anxiety  Other orders -     fluticasone (FLONASE) 50 MCG/ACT nasal spray; Place 2 sprays into both nostrils daily.   Needs labs for prevention.   Treated with augmentin, flonase, medrol dose pack for sinobronchitis.   Discussed mood. Encouraged counseling. Made referral. Start wellbutrin could help with mood and weight control. Losing weight could help with mood. Discussed regular exercise at least 150 minutes a week.   Marland Kitchen.Discussed low carb diet with 1500 calories and 80g of protein.  Exercising at least 150 minutes a week.  [employer] could be a Microbiologist.    Follow up in 3 months.

## 2020-06-30 ENCOUNTER — Encounter: Payer: Self-pay | Admitting: Neurology

## 2020-06-30 LAB — COMPLETE METABOLIC PANEL WITH GFR
AG Ratio: 1.4 (calc) (ref 1.0–2.5)
ALT: 13 U/L (ref 6–29)
AST: 14 U/L (ref 10–30)
Albumin: 4.9 g/dL (ref 3.6–5.1)
Alkaline phosphatase (APISO): 56 U/L (ref 31–125)
BUN: 11 mg/dL (ref 7–25)
CO2: 25 mmol/L (ref 20–32)
Calcium: 9.6 mg/dL (ref 8.6–10.2)
Chloride: 100 mmol/L (ref 98–110)
Creat: 0.75 mg/dL (ref 0.50–1.10)
GFR, Est African American: 114 mL/min/{1.73_m2} (ref >=60)
GFR, Est Non African American: 98 mL/min/{1.73_m2} (ref >=60)
Globulin: 3.4 g/dL (calc) (ref 1.9–3.7)
Glucose, Bld: 79 mg/dL (ref 65–139)
Potassium: 4 mmol/L (ref 3.5–5.3)
Sodium: 137 mmol/L (ref 135–146)
Total Bilirubin: 0.9 mg/dL (ref 0.2–1.2)
Total Protein: 8.3 g/dL — ABNORMAL HIGH (ref 6.1–8.1)

## 2020-06-30 LAB — CBC WITH DIFFERENTIAL/PLATELET
Absolute Monocytes: 427 cells/uL (ref 200–950)
Basophils Absolute: 67 cells/uL (ref 0–200)
Basophils Relative: 1.1 %
Eosinophils Absolute: 537 cells/uL — ABNORMAL HIGH (ref 15–500)
Eosinophils Relative: 8.8 %
HCT: 44.2 % (ref 35.0–45.0)
Hemoglobin: 14.7 g/dL (ref 11.7–15.5)
Lymphs Abs: 2184 cells/uL (ref 850–3900)
MCH: 28.6 pg (ref 27.0–33.0)
MCHC: 33.3 g/dL (ref 32.0–36.0)
MCV: 86 fL (ref 80.0–100.0)
MPV: 10.2 fL (ref 7.5–12.5)
Monocytes Relative: 7 %
Neutro Abs: 2885 cells/uL (ref 1500–7800)
Neutrophils Relative %: 47.3 %
Platelets: 403 10*3/uL — ABNORMAL HIGH (ref 140–400)
RBC: 5.14 10*6/uL — ABNORMAL HIGH (ref 3.80–5.10)
RDW: 12.5 % (ref 11.0–15.0)
Total Lymphocyte: 35.8 %
WBC: 6.1 10*3/uL (ref 3.8–10.8)

## 2020-06-30 LAB — TSH: TSH: 1.38 mIU/L

## 2020-06-30 LAB — LIPID PANEL W/REFLEX DIRECT LDL
Cholesterol: 254 mg/dL — ABNORMAL HIGH (ref <200)
HDL: 42 mg/dL — ABNORMAL LOW (ref >=50)
LDL Cholesterol (Calc): 193 mg/dL (calc) — ABNORMAL HIGH
Non-HDL Cholesterol (Calc): 212 mg/dL (calc) — ABNORMAL HIGH (ref <130)
Total CHOL/HDL Ratio: 6 (calc) — ABNORMAL HIGH (ref <5.0)
Triglycerides: 82 mg/dL (ref <150)

## 2020-06-30 NOTE — Progress Notes (Signed)
[place],   Normal hemoglobin.  Thyroid normal.  Platelets a little elevated.  Cholesterol way up. I would strongly consider taking a cholesterol medication to help decrease your risk of cardiovascular event.

## 2020-06-30 NOTE — Progress Notes (Signed)
Ok with with letter to be out of work. Ok to start wellbutrin once finished with sinusitis treatment.

## 2020-07-02 ENCOUNTER — Encounter: Payer: Self-pay | Admitting: Physician Assistant

## 2020-07-05 ENCOUNTER — Encounter: Payer: Self-pay | Admitting: Physician Assistant

## 2020-07-05 ENCOUNTER — Other Ambulatory Visit: Payer: Self-pay | Admitting: Physician Assistant

## 2020-07-05 DIAGNOSIS — R4589 Other symptoms and signs involving emotional state: Secondary | ICD-10-CM | POA: Insufficient documentation

## 2020-07-05 DIAGNOSIS — F419 Anxiety disorder, unspecified: Secondary | ICD-10-CM | POA: Insufficient documentation

## 2020-07-05 MED ORDER — ATORVASTATIN CALCIUM 20 MG PO TABS: 20.0000 mg | ORAL_TABLET | Freq: Every day | ORAL | 3 refills | Status: DC

## 2020-09-29 ENCOUNTER — Ambulatory Visit (INDEPENDENT_AMBULATORY_CARE_PROVIDER_SITE_OTHER): Payer: 59 | Admitting: Physician Assistant

## 2020-09-29 DIAGNOSIS — Z5329 Procedure and treatment not carried out because of patient's decision for other reasons: Secondary | ICD-10-CM

## 2020-09-29 NOTE — Progress Notes (Signed)
No show

## 2020-11-30 IMAGING — MG DIGITAL SCREENING BILAT W/ TOMO W/ CAD
8 series · 8 of 24 positions shown · non-contrast
Comparison: None.

CLINICAL DATA: Screening.

EXAM:
DIGITAL SCREENING BILATERAL MAMMOGRAM WITH TOMO AND CAD

[L MLO synth-2D]
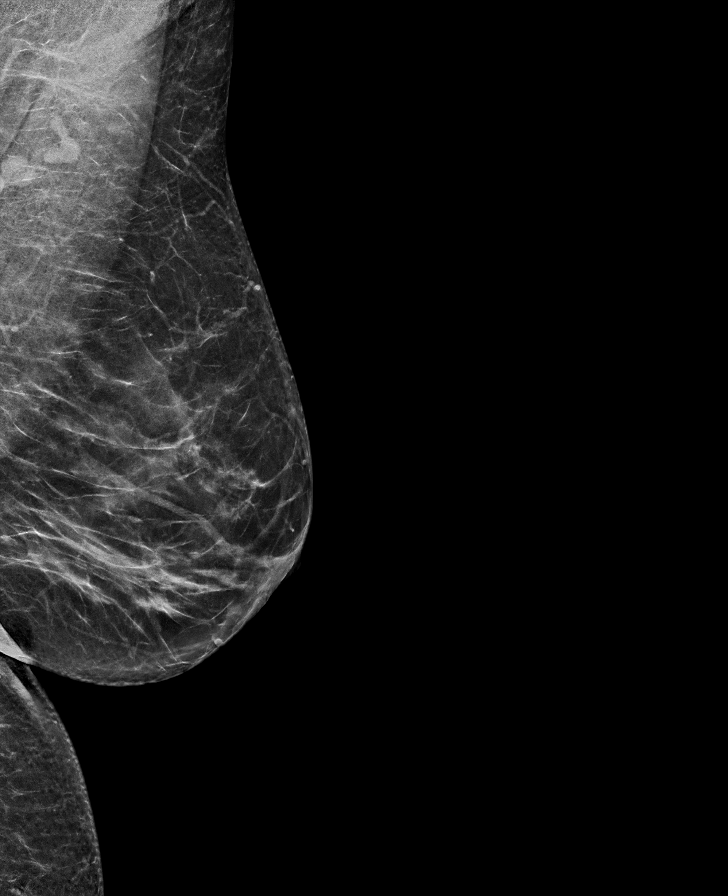

[R CC synth-2D]
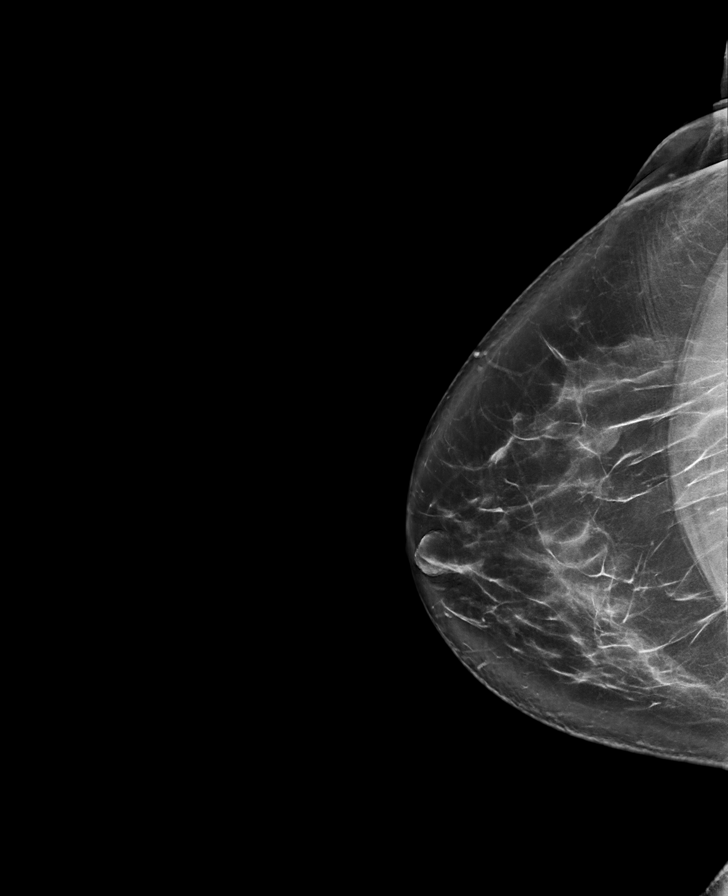

[L CC synth-2D]
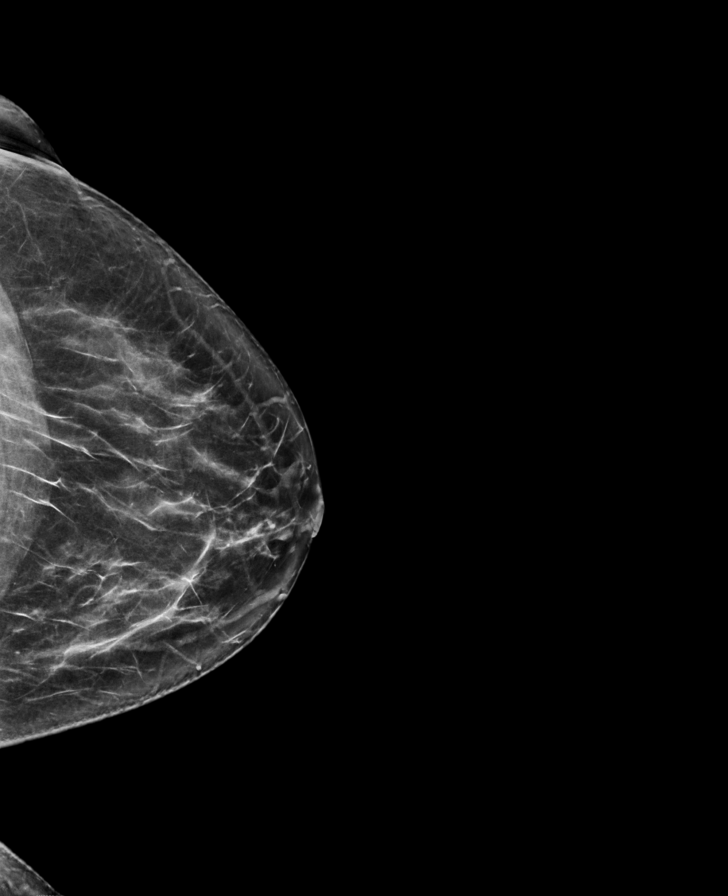

[R MLO synth-2D]
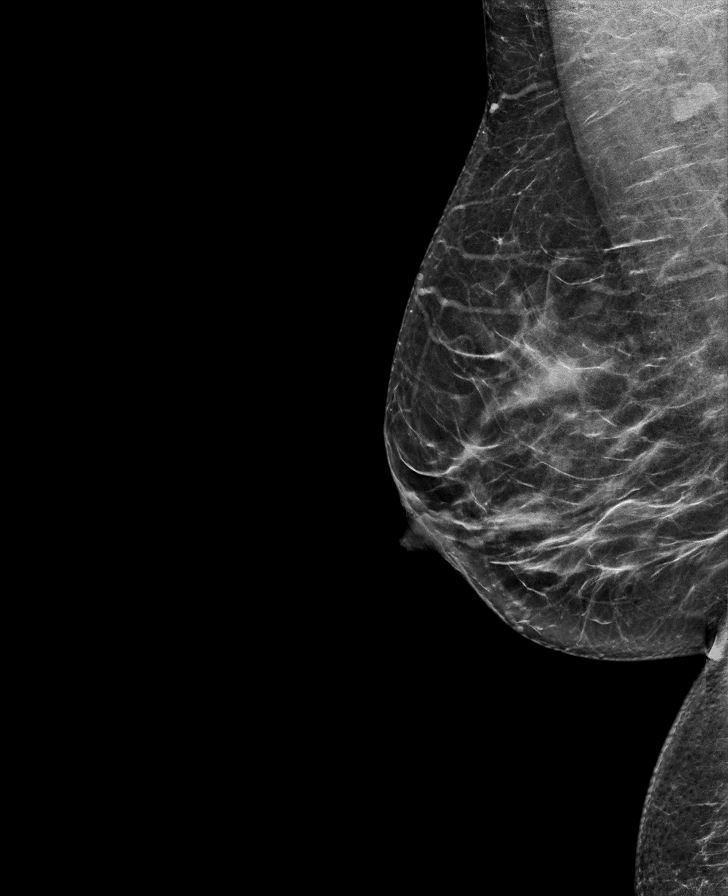

[R MLO tomo · tomo slice 35/69.0]
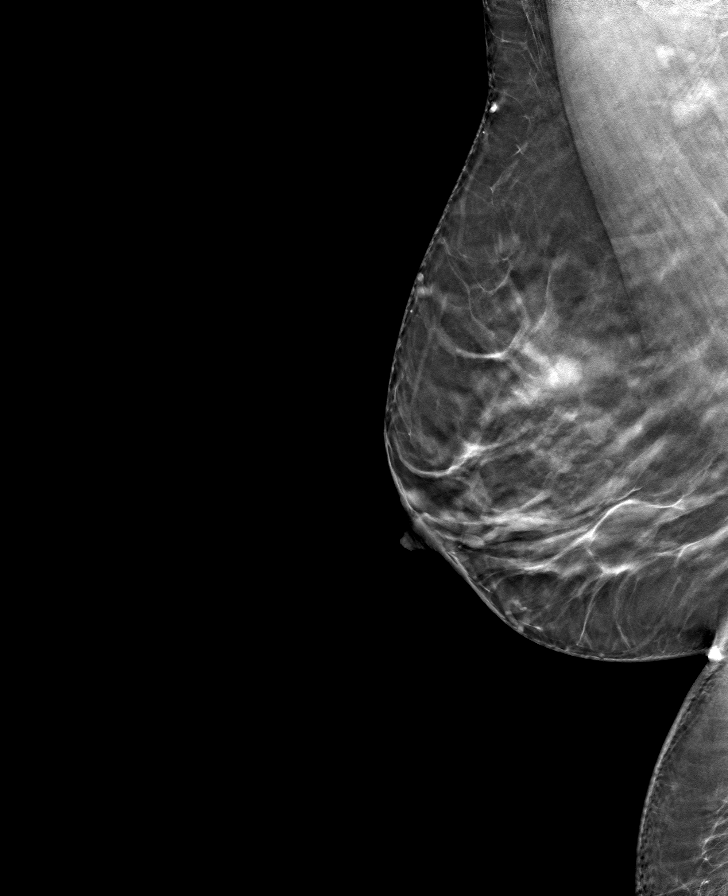

[R CC tomo · tomo slice 43/86.0]
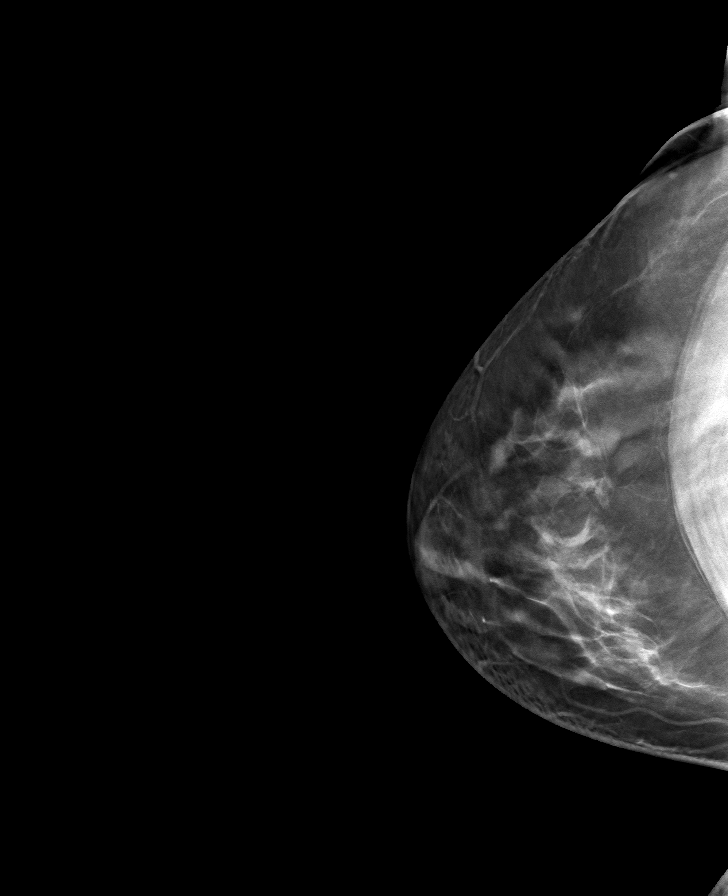

[L MLO tomo · tomo slice 34/67.0]
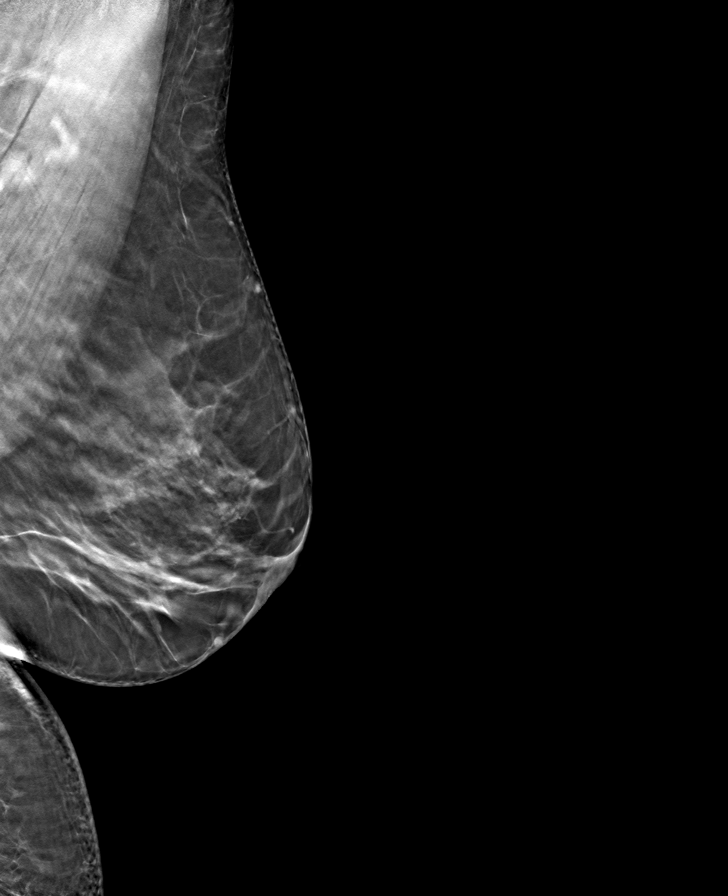

[L CC tomo · tomo slice 33/66.0]
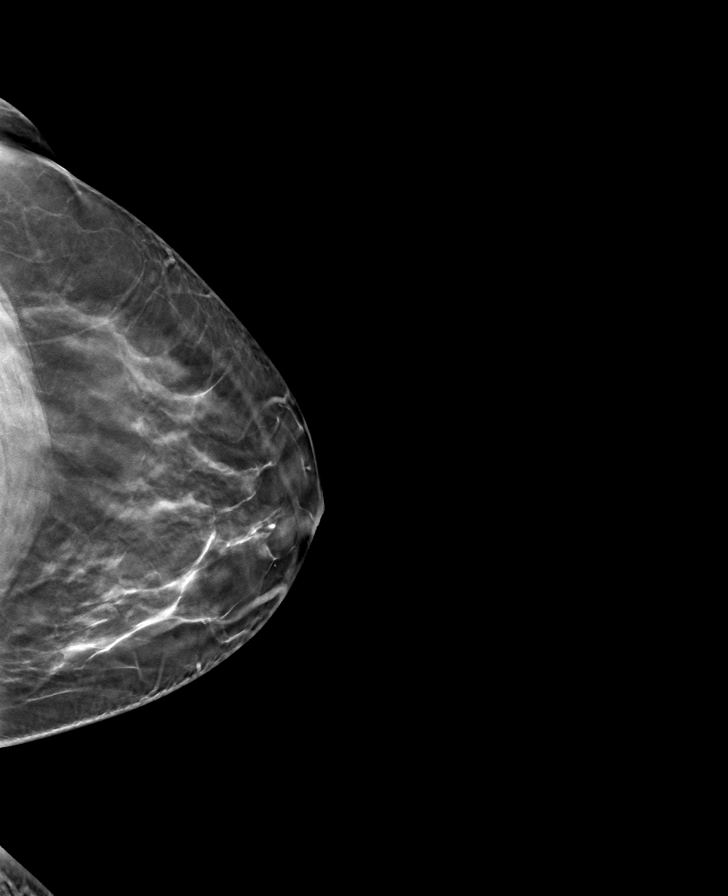

[8 of 24 positions shown; findings below may reference images not displayed]

ACR Breast Density Category c: The breast tissue is heterogeneously
dense, which may obscure small masses.
FINDINGS: In the right breast a possible mass requires further evaluation.

In the left breast a possible mass requires further evaluation.

Images were processed with CAD.
IMPRESSION: Further evaluation is suggested for a possible mass in the right
breast.

Further evaluation is suggested for a possible mass in the left
breast.

RECOMMENDATION:
Diagnostic mammogram and possibly ultrasound of both breasts.
(Code:32-0-QQN)

The patient will be contacted regarding the findings, and additional
imaging will be scheduled.

BI-RADS CATEGORY  0: Incomplete. Need additional imaging evaluation
and/or prior mammograms for comparison.

## 2021-10-17 ENCOUNTER — Encounter: Payer: Self-pay | Admitting: Physician Assistant

## 2021-10-18 ENCOUNTER — Ambulatory Visit: Payer: Managed Care, Other (non HMO) | Admitting: Physician Assistant

## 2021-10-18 ENCOUNTER — Ambulatory Visit: Payer: Managed Care, Other (non HMO)

## 2021-10-18 ENCOUNTER — Encounter: Payer: Self-pay | Admitting: Physician Assistant

## 2021-10-18 VITALS — BP 136/63 | HR 83

## 2021-10-18 DIAGNOSIS — E6609 Other obesity due to excess calories: Secondary | ICD-10-CM | POA: Diagnosis not present

## 2021-10-18 DIAGNOSIS — M25571 Pain in right ankle and joints of right foot: Secondary | ICD-10-CM | POA: Diagnosis not present

## 2021-10-18 DIAGNOSIS — Z6834 Body mass index (BMI) 34.0-34.9, adult: Secondary | ICD-10-CM

## 2021-10-18 NOTE — Progress Notes (Signed)
Acute Office Visit  Subjective:     Patient ID: Julie Reese, female    DOB: May 01, 1975, 46 y.o.   MRN: [id number]  Chief Complaint  Patient presents with   Ankle Pain    right    HPI Patient is in today for right lateral ankle pain. On 6/24 pt was getting out of her boyfriend's truck which has been raised and landed hard on her right foot.  She is having lateral foot pain so she went to urgent care on 10/14/2021.  They did not x-ray but suspected it was sprained and told her to wear good supportive shoe and take anti-inflammatories.  This has not really helped.  She is having a lot of pain when she walks.  If she just stands or is at rest there is no pain.  She does have a history of previous fracture in 2009.  See ED note:   Ankle sprain vs fx. Unlikely fx no bony tenderness. Offered to order xray. Pt would like to wait.  Continue conservative measures: Apply a compressive ACE bandage. Rest and elevate the affected painful area. Apply cold compresses intermittently as needed. As pain recedes, begin normal activities slowly as tolerated.  Ibuprofen prn as directed for pain/swelling Recommend supportive shoe at work for the next 2 weeks, note provided.  F/U here in 2 weeks if pain persists. Sooner if worse.   She does have to walk quite a bit to get into work.   .. 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      Objective:    BP 136/63   Pulse 83   SpO2 99%  BP Readings from Last 3 Encounters:  10/18/21 136/63  06/29/20 131/80  11/03/19 120/79   Wt Readings from Last 3 Encounters:  06/29/20 211 lb (95.7 kg)  04/22/19 171 lb (77.6 kg)  02/07/19 180 lb (81.6 kg)      Physical Exam Constitutional:      Appearance: Normal appearance. She is obese.  Cardiovascular:     Rate and Rhythm: Normal rate.  Musculoskeletal:     Right lower leg: No edema.     Left lower leg: No edema.     Comments: NROM of right foot No bony tenderness but tenderness along the lateral foot and peroneal tendon Pain with internal rotation of foot 5/5 strength of right foot No edema, bruising, redness  Neurological:     General: No focal deficit present.     Mental Status: She is alert and oriented to person, place, and time.  Psychiatric:        Mood and Affect: Mood normal.  Assessment & Plan:  Marland KitchenMarland KitchenCorneisha was seen today for ankle pain.  Diagnoses and all orders for this visit:  Acute right ankle pain -     DG Foot Complete Right; Future  Class 1 obesity due to excess calories without serious comorbidity with body mass index (BMI) of 34.0 to 34.9 in adult   Pain ongoing for 3 weeks worse with walking Xray ordered Likely peroneal tendonitis Given HO and exercises Placed in cam boot NSAID and/or Voltaren gel as needed Follow up in 4 weeks  .Marland KitchenDiscussed low carb diet with 1500 calories and 80g of protein.  Exercising at least 150 minutes a week.  [employer] could be a Microbiologist.  Discussed wegovy as option HO given   Iran Planas, PA-C

## 2021-10-18 NOTE — Patient Instructions (Addendum)
Semaglutide Injection (Weight Management) What is this medication? SEMAGLUTIDE (SEM a GLOO tide) promotes weight loss. It may also be used to maintain weight loss. It works by decreasing appetite. Changes to diet and exercise are often combined with this medication. This medicine may be used for other purposes; ask your health care provider or pharmacist if you have questions. COMMON BRAND NAME(S): [id number] What should I tell my care team before I take this medication? They need to know if you have any of these conditions: Endocrine tumors (MEN 2) or if someone in your family had these tumors Eye disease, vision problems Gallbladder disease History of depression or mental health disease History of pancreatitis Kidney disease Stomach or intestine problems Suicidal thoughts, plans, or attempt; a previous suicide attempt by you or a family member Thyroid cancer or if someone in your family had thyroid cancer An unusual or allergic reaction to semaglutide, other medications, foods, dyes, or preservatives Pregnant or trying to get pregnant Breast-feeding How should I use this medication? This medication is injected under the skin. You will be taught how to prepare and give it. Take it as directed on the prescription label. It is given once every week (every 7 days). Keep taking it unless your care team tells you to stop. It is important that you put your used needles and pens in a special sharps container. Do not put them in a trash can. If you do not have a sharps container, call your pharmacist or care team to get one. A special MedGuide will be given to you by the pharmacist with each prescription and refill. Be sure to read this information carefully each time. This medication comes with INSTRUCTIONS FOR USE. Ask your pharmacist for directions on how to use this medication. Read the information carefully. Talk to your pharmacist or care team if you have questions. Talk to your care team about  the use of this medication in children. While it may be prescribed for children as young as 12 years for selected conditions, precautions do apply. Overdosage: If you think you have taken too much of this medicine contact a poison control center or emergency room at once. NOTE: This medicine is only for you. Do not share this medicine with others. What if I miss a dose? If you miss a dose and the next scheduled dose is more than 2 days away, take the missed dose as soon as possible. If you miss a dose and the next scheduled dose is less than 2 days away, do not take the missed dose. Take the next dose at your regular time. Do not take double or extra doses. If you miss your dose for 2 weeks or more, take the next dose at your regular time or call your care team to talk about how to restart this medication. What may interact with this medication? Insulin and other medications for diabetes This list may not describe all possible interactions. Give your health care provider a list of all the medicines, herbs, non-prescription drugs, or dietary supplements you use. Also tell them if you smoke, drink alcohol, or use illegal drugs. Some items may interact with your medicine. What should I watch for while using this medication? Visit your care team for regular checks on your progress. It may be some time before you see the benefit from this medication. Drink plenty of fluids while taking this medication. Check with your care team if you have severe diarrhea, nausea, and vomiting, or if you sweat a  lot. The loss of too much body fluid may make it dangerous for you to take this medication. This medication may affect blood sugar levels. Ask your care team if changes in diet or medications are needed if you have diabetes. If you or your family notice any changes in your behavior, such as new or worsening depression, thoughts of harming yourself, anxiety, other unusual or disturbing thoughts, or memory loss, call  your care team right away. Women should inform their care team if they wish to become pregnant or think they might be pregnant. Losing weight while pregnant is not advised and may cause harm to the unborn child. Talk to your care team for more information. What side effects may I notice from receiving this medication? Side effects that you should report to your care team as soon as possible: Allergic reactions--skin rash, itching, hives, swelling of the face, lips, tongue, or throat Change in vision Dehydration--increased thirst, dry mouth, feeling faint or lightheaded, headache, dark yellow or brown urine Gallbladder problems--severe stomach pain, nausea, vomiting, fever Heart palpitations--rapid, pounding, or irregular heartbeat Kidney injury--decrease in the amount of urine, swelling of the ankles, hands, or feet Pancreatitis--severe stomach pain that spreads to your back or gets worse after eating or when touched, fever, nausea, vomiting Thoughts of suicide or self-harm, worsening mood, feelings of depression Thyroid cancer--new mass or lump in the neck, pain or trouble swallowing, trouble breathing, hoarseness Side effects that usually do not require medical attention (report to your care team if they continue or are bothersome): Diarrhea Loss of appetite Nausea Stomach pain Vomiting This list may not describe all possible side effects. Call your doctor for medical advice about side effects. You may report side effects to FDA at 1-800-FDA-1088. Where should I keep my medication? Keep out of the reach of children and pets. Refrigeration (preferred): Store in the refrigerator. Do not freeze. Keep this medication in the original container until you are ready to take it. Get rid of any unused medication after the expiration date. Room temperature: If needed, prior to cap removal, the pen can be stored at room temperature for up to 28 days. Protect from light. If it is stored at room  temperature, get rid of any unused medication after 28 days or after it expires, whichever is first. It is important to get rid of the medication as soon as you no longer need it or it is expired. You can do this in two ways: Take the medication to a medication take-back program. Check with your pharmacy or law enforcement to find a location. If you cannot return the medication, follow the directions in the [place]. NOTE: This sheet is a summary. It may not cover all possible information. If you have questions about this medicine, talk to your doctor, pharmacist, or health care provider.  2023 Elsevier/Gold Standard (2021-04-13 00:00:00)   Peroneal Tendinopathy Rehab Ask your health care provider which exercises are safe for you. Do exercises exactly as told by your health care provider and adjust them as directed. It is normal to feel mild stretching, pulling, tightness, or discomfort as you do these exercises. Stop right away if you feel sudden pain or your pain gets worse. Do not begin these exercises until told by your health care provider. Stretching and range-of-motion exercises These exercises warm up your muscles and joints and improve the movement and flexibility of your ankle. These exercises also help to relieve pain and stiffness. Gastroc and soleus stretch, standing This is an exercise  in which you stand on a step and use your body weight to stretch your calf muscles. To do this exercise: Stand on the edge of a step on the ball of your left / right foot. The ball of your foot is on the walking surface, right under your toes. Keep your other foot firmly on the same step. Hold on to the wall, a railing, or a chair for balance. Slowly lift your other foot, allowing your body weight to press your left / right heel down over the edge of the step. You should feel a stretch in your left / right calf (gastrocnemius and soleus). Hold this position for __________ seconds. Return both feet to  the step. Repeat this exercise with a slight bend in your left / right knee. Repeat __________ times with your left / right knee straight and __________ times with your left / right knee bent. Complete this exercise __________ times a day. Strengthening exercises These exercises build strength and endurance in your foot and ankle. Endurance is the ability to use your muscles for a long time, even after they get tired. Ankle dorsiflexion with band  Secure a rubber exercise band or tube to an object, such as a table leg, that will not move when the band is pulled. Secure the other end of the band around your left / right foot. Sit on the floor, facing the object with your left / right leg extended. The band or tube should be slightly tense when your foot is relaxed. Slowly flex your left / right ankle and toes to bring your foot toward you (dorsiflexion). Hold this position for __________ seconds. Let the band or tube slowly pull your foot back to the starting position. Repeat __________ times. Complete this exercise __________ times a day. Ankle eversion Sit on the floor with your legs straight out in front of you. Loop a rubber exercise band or tube around the ball of your left / right foot. The ball of your foot is on the walking surface, right under your toes. Hold the ends of the band in your hands, or secure the band to a stable object. The band or tube should be slightly tense when your foot is relaxed. Slowly push your foot outward, away from your other leg (eversion). Hold this position for __________ seconds. Slowly return your foot to the starting position. Repeat __________ times. Complete this exercise __________ times a day. Plantar flexion, standing This exercise is sometimes called standing heel raise. Stand with your feet shoulder-width apart. Place your hands on a wall or table to steady yourself as needed, but try not to use it for support. Keep your weight spread evenly  over the width of your feet while you slowly rise up on your toes (plantar flexion). If told by your health care provider: Shift your weight toward your left / right leg until you feel challenged. Stand on your left / right leg only. Hold this position for __________ seconds. Repeat __________ times. Complete this exercise __________ times a day. Single leg stand Without shoes, stand near a railing or in a doorway. You may hold on to the railing or door frame as needed. Stand on your left / right foot. Keep your big toe down on the floor and try to keep your arch lifted. Do not roll to the outside of your foot. If this exercise is too easy, you can try it with your eyes closed or while standing on a pillow. Hold this position  for __________ seconds. Repeat __________ times. Complete this exercise __________ times a day. This information is not intended to replace advice given to you by your health care provider. Make sure you discuss any questions you have with your health care provider. Document Revised: 07/16/2018 Document Reviewed: 07/16/2018 Elsevier Patient Education  [place].

## 2021-10-18 NOTE — Progress Notes (Signed)
No acute fracture.  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

## 2021-10-24 MED ORDER — WEGOVY 0.25 MG/0.5ML ~~LOC~~ SOAJ: 0.2500 mg | SUBCUTANEOUS | 0 refills | Status: DC

## 2021-10-24 MED ORDER — WEGOVY 0.5 MG/0.5ML ~~LOC~~ SOAJ: 0.5000 mg | SUBCUTANEOUS | 0 refills | Status: DC

## 2021-10-24 MED ORDER — WEGOVY 1 MG/0.5ML ~~LOC~~ SOAJ: 1.0000 mg | SUBCUTANEOUS | 0 refills | Status: DC

## 2021-11-02 ENCOUNTER — Encounter: Payer: Self-pay | Admitting: Physician Assistant

## 2021-11-04 NOTE — Telephone Encounter (Signed)
Patient has been scheduled with Dr T on 11/08/21. AMUCK

## 2021-11-04 NOTE — Telephone Encounter (Signed)
Since new and worsening symptoms recommend schedule with Dr. Darene Lamer next week.

## 2021-11-08 ENCOUNTER — Ambulatory Visit: Payer: Managed Care, Other (non HMO) | Admitting: Sports Medicine

## 2021-11-08 ENCOUNTER — Ambulatory Visit: Payer: Managed Care, Other (non HMO)

## 2021-11-08 DIAGNOSIS — M25571 Pain in right ankle and joints of right foot: Secondary | ICD-10-CM | POA: Diagnosis not present

## 2021-11-08 DIAGNOSIS — G8929 Other chronic pain: Secondary | ICD-10-CM

## 2021-11-08 MED ORDER — MELOXICAM 15 MG PO TABS: ORAL_TABLET | ORAL | 3 refills | Status: DC

## 2021-11-08 NOTE — Assessment & Plan Note (Signed)
Pleasant 46 year old female, inversion injury about 6 weeks ago, persistent pain localized predominantly posterior to the lateral malleolus. She has tenderness here and reproduction of pain with resisted eversion, does not really have any other discrete areas of tenderness. I do suspect a peroneal tendinopathy, we will get x-rays of her ankle, MRI of the ankle considering greater than 6 weeks of physician directed conservative treatment with persistence of pain. Adding meloxicam, peroneal conditioning, return to see me to go over MRI results.

## 2021-11-08 NOTE — Progress Notes (Signed)
    Procedures performed today:    None.  Independent interpretation of notes and tests performed by another provider:   None.  Brief History, Exam, Impression, and Recommendations:    Chronic pain of right ankle Pleasant 46 year old female, inversion injury about 6 weeks ago, persistent pain localized predominantly posterior to the lateral malleolus. She has tenderness here and reproduction of pain with resisted eversion, does not really have any other discrete areas of tenderness. I do suspect a peroneal tendinopathy, we will get x-rays of her ankle, MRI of the ankle considering greater than 6 weeks of physician directed conservative treatment with persistence of pain. Adding meloxicam, peroneal conditioning, return to see me to go over MRI results.    ____________________________________________ Gwen Her. Dianah Field, M.D., ABFM., CAQSM., AME. Primary Care and Sports Medicine [facility]  Adjunct Professor of [employer]  Risk manager

## 2021-11-09 ENCOUNTER — Encounter: Payer: Self-pay | Admitting: Sports Medicine

## 2021-11-12 ENCOUNTER — Ambulatory Visit (INDEPENDENT_AMBULATORY_CARE_PROVIDER_SITE_OTHER): Payer: Managed Care, Other (non HMO)

## 2021-11-12 DIAGNOSIS — G8929 Other chronic pain: Secondary | ICD-10-CM | POA: Diagnosis not present

## 2021-11-12 DIAGNOSIS — M25571 Pain in right ankle and joints of right foot: Secondary | ICD-10-CM

## 2021-11-14 ENCOUNTER — Encounter (INDEPENDENT_AMBULATORY_CARE_PROVIDER_SITE_OTHER): Payer: Managed Care, Other (non HMO) | Admitting: Sports Medicine

## 2021-11-14 DIAGNOSIS — G8929 Other chronic pain: Secondary | ICD-10-CM | POA: Diagnosis not present

## 2021-11-14 DIAGNOSIS — M25571 Pain in right ankle and joints of right foot: Secondary | ICD-10-CM | POA: Diagnosis not present

## 2021-11-15 ENCOUNTER — Ambulatory Visit: Payer: Managed Care, Other (non HMO) | Admitting: Physician Assistant

## 2021-11-17 NOTE — Telephone Encounter (Signed)
I spent 5 total minutes of online digital evaluation and management services in this patient-initiated request for online care. 

## 2021-12-21 ENCOUNTER — Ambulatory Visit: Payer: Managed Care, Other (non HMO) | Admitting: Medical-Surgical

## 2021-12-27 ENCOUNTER — Other Ambulatory Visit: Payer: Self-pay | Admitting: Physician Assistant

## 2021-12-27 DIAGNOSIS — Z1231 Encounter for screening mammogram for malignant neoplasm of breast: Secondary | ICD-10-CM

## 2022-01-24 ENCOUNTER — Ambulatory Visit: Payer: Managed Care, Other (non HMO) | Admitting: Physician Assistant

## 2022-01-24 ENCOUNTER — Other Ambulatory Visit (HOSPITAL_COMMUNITY)
Admission: RE | Admit: 2022-01-24 | Discharge: 2022-01-24 | Disposition: A | Discharge status: Home / Self Care | Payer: Managed Care, Other (non HMO) | Source: Ambulatory Visit | Attending: Physician Assistant | Admitting: Physician Assistant

## 2022-01-24 ENCOUNTER — Encounter: Payer: Self-pay | Admitting: Physician Assistant

## 2022-01-24 VITALS — BP 124/45 | HR 79 | Ht 63.0 in | Wt 210.0 lb

## 2022-01-24 DIAGNOSIS — Z6837 Body mass index (BMI) 37.0-37.9, adult: Secondary | ICD-10-CM

## 2022-01-24 DIAGNOSIS — M25571 Pain in right ankle and joints of right foot: Secondary | ICD-10-CM | POA: Diagnosis not present

## 2022-01-24 DIAGNOSIS — Z0000 Encounter for general adult medical examination without abnormal findings: Secondary | ICD-10-CM | POA: Diagnosis not present

## 2022-01-24 DIAGNOSIS — R61 Generalized hyperhidrosis: Secondary | ICD-10-CM

## 2022-01-24 DIAGNOSIS — E6609 Other obesity due to excess calories: Secondary | ICD-10-CM

## 2022-01-24 DIAGNOSIS — Z1231 Encounter for screening mammogram for malignant neoplasm of breast: Secondary | ICD-10-CM

## 2022-01-24 DIAGNOSIS — Z124 Encounter for screening for malignant neoplasm of cervix: Secondary | ICD-10-CM

## 2022-01-24 DIAGNOSIS — Z131 Encounter for screening for diabetes mellitus: Secondary | ICD-10-CM

## 2022-01-24 DIAGNOSIS — Z13220 Encounter for screening for lipoid disorders: Secondary | ICD-10-CM

## 2022-01-24 DIAGNOSIS — G8929 Other chronic pain: Secondary | ICD-10-CM

## 2022-01-24 MED ORDER — BUPROPION HCL ER (SR) 150 MG PO TB12: 150.0000 mg | ORAL_TABLET | Freq: Two times a day (BID) | ORAL | 2 refills | Status: DC

## 2022-01-24 MED ORDER — ALUMINUM CHLORIDE 20 % EX SOLN: CUTANEOUS | 0 refills | Status: DC

## 2022-01-24 NOTE — Patient Instructions (Signed)

## 2022-01-25 NOTE — Progress Notes (Signed)
Subjective:     Julie Reese is a 46 y.o. female and is here for a comprehensive physical exam. The patient reports problems - ongoing chronic pain of right ankle, weight gain struggles and excessive sweating .  Seen Julie Reese. MRI normal. Wearing brace daily.   Not losing weight. Wegovy not approved.   Excessive axilla sweating. Deodorant not helping after trying many different kinds.    Social History   Socioeconomic History   Marital status: Significant Other    Spouse name: Not on file   Number of children: Not on file   Years of education: Not on file   Highest education level: Not on file  Occupational History   Not on file  Tobacco Use   Smoking status: Never   Smokeless tobacco: Never  Substance and Sexual Activity   Alcohol use: Yes    Comment: occ   Drug use: No   Sexual activity: Not Currently  Other Topics Concern   Not on file  Social History Narrative   Not on file   Social Determinants of Health   Financial Resource Strain: Not on file  Food Insecurity: Not on file  Transportation Needs: Not on file  Physical Activity: Not on file  Stress: Not on file  Social Connections: Not on file  Intimate Partner Violence: Not on file   Health Maintenance  Topic Date Due   Hepatitis C Screening  Never done   COVID-19 Vaccine (4 - Pfizer risk series) 07/08/2020   PAP SMEAR-Modifier  01/31/2022   TETANUS/TDAP  05/30/2026   HIV Screening  Completed   HPV VACCINES  Aged Out   INFLUENZA VACCINE  Discontinued    The following portions of the patient's history were reviewed and updated as appropriate: allergies, current medications, past family history, past medical history, past social history, past surgical history, and problem list.  Review of Systems Pertinent items noted in HPI and remainder of comprehensive ROS otherwise negative.   Objective:   Marland KitchenMarland Kitchen    01/24/2022    1:34 PM 06/29/2020    1:55 PM 12/18/2018    9:47 AM 06/01/2017   10:01 AM 06/27/2016     9:18 AM  Depression screen PHQ 2/9  Decreased Interest 0 3 0 0 0  Down, Depressed, Hopeless 0 1 0 0 0  PHQ - 2 Score 0 4 0 0 0  Altered sleeping  3 0    Tired, decreased energy  1 0    Change in appetite  1 0    Feeling bad or failure about yourself   2 0    Trouble concentrating  0 0    Moving slowly or fidgety/restless  0 0    Suicidal thoughts  0 0    PHQ-9 Score  11 0    Difficult doing work/chores  Somewhat difficult Not difficult at all       BP (!) 124/45   Pulse 79   Ht '5\' 3"'$  (1.6 m)   Wt 210 lb (95.3 kg)   SpO2 99%   BMI 37.20 kg/m  General appearance: alert, cooperative, appears stated age, and moderately obese Head: Normocephalic, without obvious abnormality, atraumatic Eyes: conjunctivae/corneas clear. PERRL, EOM's intact. Fundi benign. Ears: normal TM's and external ear canals both ears Nose: Nares normal. Septum midline. Mucosa normal. No drainage or sinus tenderness. Throat: lips, mucosa, and tongue normal; teeth and gums normal Neck: no adenopathy, no carotid bruit, no JVD, supple, symmetrical, trachea midline, and thyroid not enlarged,  symmetric, no tenderness/mass/nodules Back: symmetric, no curvature. ROM normal. No CVA tenderness. Lungs: clear to auscultation bilaterally Heart: regular rate and rhythm, S1, S2 normal, no murmur, click, rub or gallop Abdomen: soft, non-tender; bowel sounds normal; no masses,  no organomegaly Pelvic: cervix normal in appearance, external genitalia normal, no adnexal masses or tenderness, no cervical motion tenderness, uterus normal size, shape, and consistency, vagina normal without discharge, and friable cervix Extremities: extremities normal, atraumatic, no cyanosis or edema Pulses: 2+ and symmetric Skin: Skin color, texture, turgor normal. No rashes or lesions Lymph nodes: Cervical, supraclavicular, and axillary nodes normal. Neurologic: Grossly normal    Assessment:    Healthy female exam.      Plan:  Marland KitchenMarland KitchenKhushboo  was seen today for annual exam.  Diagnoses and all orders for this visit:  Routine physical examination -     Lipid Panel w/reflex Direct LDL -     COMPLETE METABOLIC PANEL WITH GFR -     TSH -     CBC w/Diff/Platelet  Papanicolaou smear -     Cytology - PAP  Chronic pain of right ankle -     Ambulatory referral to Podiatry  Visit for screening mammogram -     MM 3D SCREEN BREAST BILATERAL; Future  Class 2 obesity due to excess calories without serious comorbidity with body mass index (BMI) of 37.0 to 37.9 in adult -     buPROPion (WELLBUTRIN SR) 150 MG 12 hr tablet; Take 1 tablet (150 mg total) by mouth 2 (two) times daily.  Screening for lipid disorders -     Lipid Panel w/reflex Direct LDL  Screening for diabetes mellitus -     COMPLETE METABOLIC PANEL WITH GFR  Excessive sweating -     aluminum chloride (DRYSOL) 20 % external solution; Apply weekly to areas of excessive sweat.   .. Discussed 150 minutes of exercise a week.  Encouraged vitamin D 1000 units and Calcium '1300mg'$  or 4 servings of dairy a day.  Fasting labs ordered PHQ no concerns Pap done today with STD testing Mammogram ordered Referral for ankle pain due to ongoing nature Drysol for excessive sweating  .Marland KitchenDiscussed low carb diet with 1500 calories and 80g of protein.  Exercising at least 150 minutes a week.  [employer] could be a Microbiologist.  Start wellbutrin bid  Follow up in 3 months   See After Visit Summary for Counseling Recommendations

## 2022-01-26 LAB — CYTOLOGY - PAP
Chlamydia: NEGATIVE
Comment: NEGATIVE
Comment: NEGATIVE
Comment: NORMAL
Diagnosis: NEGATIVE
High risk HPV: NEGATIVE
Neisseria Gonorrhea: NEGATIVE

## 2022-01-27 ENCOUNTER — Other Ambulatory Visit: Payer: Self-pay | Admitting: Physician Assistant

## 2022-01-27 MED ORDER — METRONIDAZOLE 500 MG PO TABS: 500.0000 mg | ORAL_TABLET | Freq: Two times a day (BID) | ORAL | 0 refills | Status: AC

## 2022-01-27 NOTE — Progress Notes (Signed)
Normal cells and negative for STDs or HPV.   You do have bacterial vaginosis(overgrowth of normal bacteria). Will send metronidazole to treat.

## 2022-02-09 ENCOUNTER — Telehealth: Payer: Self-pay

## 2022-02-09 ENCOUNTER — Encounter: Payer: Self-pay | Admitting: Podiatrist

## 2022-02-09 ENCOUNTER — Ambulatory Visit: Payer: Managed Care, Other (non HMO) | Admitting: Podiatrist

## 2022-02-09 DIAGNOSIS — S99911A Unspecified injury of right ankle, initial encounter: Secondary | ICD-10-CM | POA: Diagnosis not present

## 2022-02-09 DIAGNOSIS — M722 Plantar fascial fibromatosis: Secondary | ICD-10-CM | POA: Diagnosis not present

## 2022-02-09 NOTE — Progress Notes (Addendum)
Chief Complaint  Patient presents with   Ankle Pain    Right ankle pain     HPI: Patient is 46 y.o. female who presents today for pain in the right foot and ankle. She relates she jumped down from a lifted [place] around June and after this started having foot pain.  She has tried an ankle brace with some relief.  She has had xrays and an MRI which are both negative for any soft tissue or boney pathology.  She tried a prescription antiinflammatory that upset her stomach.  She is sensitive to medications especially antiinflammatories.    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 225 mL 0   buPROPion (WELLBUTRIN SR) 150 MG 12 hr tablet Take 1 tablet (150 mg total) by mouth 2 (two) times daily. 60 tablet 2   cetirizine (ZYRTEC) 10 MG tablet Take 10 mg by mouth daily.     Multiple Vitamin (MULTIVITAMIN) capsule Take by mouth.     Multiple Vitamins-Minerals (IMMUNE SUPPORT PO) Take 1 tablet by mouth daily. vitafusion     No current facility-administered medications on file prior to visit.    Allergies  Allergen  Reactions   Doxycycline     RASH on face. She has been out in sun a lot.    Percocet [Oxycodone-Acetaminophen] Other (See Comments)    "makes loopy and unaware of self"    Review of Systems No fevers, chills, nausea, muscle aches, no difficulty breathing, no calf pain, no chest pain or shortness of breath.   Physical Exam  GENERAL APPEARANCE: Alert, conversant. Appropriately groomed. No acute distress.   VASCULAR: Pedal pulses palpable 24 DP and  PT right foot.  Capillary refill time is immediate to all digits,  Proximal to distal cooling is warm to warm.  Digital perfusion adequate.   NEUROLOGIC: sensation is intact to 5.07 monofilament at 5/5 sites right foot.  Light touch is intact bilateral, vibratory sensation intact   MUSCULOSKELETAL: acceptable muscle strength, tone and stability right.  Mild pes planovalgus noted right.    No gross boney pedal deformities noted.  Pain on palpation plantar medial and plantar central heel consistent with plantar fasciitis.    DERMATOLOGIC: skin is warm, supple, and dry.  Color, texture, and turgor of skin within normal limits.  No open wounds are noted.  No preulcerative lesions are seen.  Digital nails are asymptomatic.    Xrays reviewed and show no inferior calcaneal spurring present.  Small posterior spur noted. Not in the area of pain.  No fracture - agree with radiologist read  MRI also reviewed.  No soft tissue pathology identified in the read.  May consider an overread depending on how she responds to physical therapy.   Assessment     ICD-10-CM   1. Injury of right ankle, initial encounter  S99.911A Ambulatory referral to Physical Therapy    CANCELED: DG Ankle Complete Right    2. Plantar fascial fibromatosis  M72.2 Ambulatory referral to Physical Therapy       Plan  Discussed exam findings and past xray and MRI findings.  Discussed at presentation today she appears to have plantar fasciitis-  it may have developed due to her  jumping down off the truck and has failed to resolve on its own.  Discussed injections which she declined.  Discussed we could also try physical therapy and an order was written. Also discussed over the counter inserts to try-  she will try superfeet inserts.   We did not have the correct size in powerstep insert for her.   Dispensed stretching exercises for her to start. Also discussed taking an antiinflammatory medication like motrin if she can tolerate this.    She called back and asked for a handicap sticker-  I will agree to a temporary plaquard for 3 months.  Also recommend wearing sneakers as shoegear for the next three months while her foot is recovering.   I will see her back in 6-8 weeks for recheck of the foot after she has been in physical therapy for that time period.

## 2022-02-09 NOTE — Patient Instructions (Signed)
Go to [employer]- they have Galt inserts--  pink, blue and green are my favorites so try them and see which works for you.  I will order some physical therapy for you- they are great-  if you have any trouble, let us know  Also, over the counter voltaren gel might be helpful. Massage into the bottom of your foot 2-4 times daily for pain.   Plantar Fasciitis (Heel Spur Syndrome) with Rehab The plantar fascia is a fibrous, ligament-like, soft-tissue structure that spans the bottom of the foot. Plantar fasciitis is a condition that causes pain in the foot due to inflammation of the tissue. SYMPTOMS  Pain and tenderness on the underneath side of the foot. Pain that worsens with standing or walking. CAUSES  Plantar fasciitis is caused by irritation and injury to the plantar fascia on the underneath side of the foot. Common mechanisms of injury include: Direct trauma to bottom of the foot. Damage to a small nerve that runs under the foot where the main fascia attaches to the heel bone. Stress placed on the plantar fascia due to any mild increased activity or injury RISK INCREASES WITH:  Obesity. Poor strength and flexibility. Improperly fitted shoes. Tight calf muscles. Flat feet. Failure to warm-up properly before activity.  PREVENTION Warm up and stretch properly before activity. Strength, flexibility Maintain a health body weight. Avoid stress on the plantar fascia. Wear properly fitted shoes, including arch supports for individuals who have flat feet. PROGNOSIS  If treated properly, then the symptoms of plantar fasciitis usually resolve without surgery. However, occasionally surgery is necessary. RELATED COMPLICATIONS  Recurrent symptoms that may result in a chronic condition. Problems of the lower back that are caused by compensating for the injury, such as limping. Pain or weakness of the foot during push-off following surgery. Chronic inflammation, scarring, and  partial or complete fascia tear, occurring more often from repeated injections. TREATMENT  Treatment initially involves the use of ice and medication to help reduce pain and inflammation. The use of strengthening and stretching exercises may help reduce pain with activity, especially stretches of the Achilles tendon.  Your caregiver may recommend that you use arch supports to help reduce stress on the plantar fascia. Often, corticosteroid injections are given to reduce inflammation. If symptoms persist for greater than 6 months despite non-surgical (conservative), then surgery may be recommended.  MEDICATION  If pain medication is necessary, then nonsteroidal anti-inflammatory medications, such as aspirin and ibuprofen, or other minor pain relievers, such as acetaminophen, are often recommended. Corticosteroid injections may be given by your caregiver.  HEAT AND COLD Cold treatment (icing) relieves pain and reduces inflammation. Cold treatment should be applied for 10 to 15 minutes every 2 to 3 hours for inflammation and pain and immediately after any activity that aggravates your symptoms. Use ice packs or massage the area with a piece of ice (ice massage). Heat treatment may be used prior to performing the stretching and strengthening activities prescribed by your caregiver, physical therapist, or athletic trainer. Use a heat pack or soak the injury in warm water. SEEK IMMEDIATE MEDICAL CARE IF: Treatment seems to offer no benefit, or the condition worsens. Any medications produce adverse side effects.  Perform this particular stretch daily first thing in the morning and before you go to bed. Hold for 30 seconds.    Try all the exercises and choose your favorite 3 to perform daily--  EXERCISES-- perform each exercise a total of 10-15 repetitions.  Hold for 30  seconds and perform 3 times per day   RANGE OF MOTION (ROM) AND STRETCHING EXERCISES - Plantar Fasciitis (Heel Spur Syndrome) These  exercises may help you when beginning to rehabilitate your injury.   While completing these exercises, remember:  Restoring tissue flexibility helps normal motion to return to the joints. This allows healthier, less painful movement and activity. An effective stretch should be held for at least 30 seconds. A stretch should never be painful. You should only feel a gentle lengthening or release in the stretched tissue. RANGE OF MOTION - Toe Extension, Flexion Sit with your right / left leg crossed over your opposite knee. Grasp your toes and gently pull them back toward the top of your foot. You should feel a stretch on the bottom of your toes and/or foot. Hold this stretch for __________ seconds. Now, gently pull your toes toward the bottom of your foot. You should feel a stretch on the top of your toes and or foot. Hold this stretch for __________ seconds. Repeat __________ times. Complete this stretch __________ times per day.  RANGE OF MOTION - Ankle Dorsiflexion, Active Assisted Remove shoes and sit on a chair that is preferably not on a carpeted surface. Place right / left foot under knee. Extend your opposite leg for support. Keeping your heel down, slide your right / left foot back toward the chair until you feel a stretch at your ankle or calf. If you do not feel a stretch, slide your bottom forward to the edge of the chair, while still keeping your heel down. Hold this stretch for __________ seconds. Repeat __________ times. Complete this stretch __________ times per day.  STRETCH  Gastroc, Standing Place hands on wall. Extend right / left leg, keeping the front knee somewhat bent. Slightly point your toes inward on your back foot. Keeping your right / left heel on the floor and your knee straight, shift your weight toward the wall, not allowing your back to arch. You should feel a gentle stretch in the right / left calf. Hold this position for __________ seconds. Repeat __________  times. Complete this stretch __________ times per day. STRETCH  Soleus, Standing Place hands on wall. Extend right / left leg, keeping the other knee somewhat bent. Slightly point your toes inward on your back foot. Keep your right / left heel on the floor, bend your back knee, and slightly shift your weight over the back leg so that you feel a gentle stretch deep in your back calf. Hold this position for __________ seconds. Repeat __________ times. Complete this stretch __________ times per day. STRETCH  Gastrocsoleus, Standing  Note: This exercise can place a lot of stress on your foot and ankle. Please complete this exercise only if specifically instructed by your caregiver.  Place the ball of your right / left foot on a step, keeping your other foot firmly on the same step. Hold on to the wall or a rail for balance. Slowly lift your other foot, allowing your body weight to press your heel down over the edge of the step. You should feel a stretch in your right / left calf. Hold this position for __________ seconds. Repeat this exercise with a slight bend in your right / left knee. Repeat __________ times. Complete this stretch __________ times per day.  STRENGTHENING EXERCISES - Plantar Fasciitis (Heel Spur Syndrome)  These exercises may help you when beginning to rehabilitate your injury. They may resolve your symptoms with or without further involvement from your  physician, physical therapist or athletic trainer. While completing these exercises, remember:  Muscles can gain both the endurance and the strength needed for everyday activities through controlled exercises. Complete these exercises as instructed by your physician, physical therapist or athletic trainer. Progress the resistance and repetitions only as guided.

## 2022-02-10 LAB — LIPID PANEL W/REFLEX DIRECT LDL
Cholesterol: 223 mg/dL — ABNORMAL HIGH (ref <200)
HDL: 39 mg/dL — ABNORMAL LOW (ref >=50)
LDL Cholesterol (Calc): 164 mg/dL (calc) — ABNORMAL HIGH
Non-HDL Cholesterol (Calc): 184 mg/dL (calc) — ABNORMAL HIGH (ref <130)
Total CHOL/HDL Ratio: 5.7 (calc) — ABNORMAL HIGH (ref <5.0)
Triglycerides: 92 mg/dL (ref <150)

## 2022-02-10 LAB — COMPLETE METABOLIC PANEL WITH GFR
AG Ratio: 1.4 (calc) (ref 1.0–2.5)
ALT: 13 U/L (ref 6–29)
AST: 15 U/L (ref 10–30)
Albumin: 4.5 g/dL (ref 3.6–5.1)
Alkaline phosphatase (APISO): 60 U/L (ref 31–125)
BUN: 10 mg/dL (ref 7–25)
CO2: 28 mmol/L (ref 20–32)
Calcium: 9.1 mg/dL (ref 8.6–10.2)
Chloride: 104 mmol/L (ref 98–110)
Creat: 0.77 mg/dL (ref 0.50–0.99)
Globulin: 3.2 g/dL (calc) (ref 1.9–3.7)
Glucose, Bld: 85 mg/dL (ref 65–99)
Potassium: 4 mmol/L (ref 3.5–5.3)
Sodium: 139 mmol/L (ref 135–146)
Total Bilirubin: 0.6 mg/dL (ref 0.2–1.2)
Total Protein: 7.7 g/dL (ref 6.1–8.1)
eGFR: 97 mL/min/{1.73_m2} (ref >=60)

## 2022-02-10 LAB — CBC WITH DIFFERENTIAL/PLATELET
Absolute Monocytes: 370 cells/uL (ref 200–950)
Basophils Absolute: 38 cells/uL (ref 0–200)
Basophils Relative: 0.8 %
Eosinophils Absolute: 432 cells/uL (ref 15–500)
Eosinophils Relative: 9 %
HCT: 41.8 % (ref 35.0–45.0)
Hemoglobin: 14.1 g/dL (ref 11.7–15.5)
Lymphs Abs: 1814 cells/uL (ref 850–3900)
MCH: 28.6 pg (ref 27.0–33.0)
MCHC: 33.7 g/dL (ref 32.0–36.0)
MCV: 84.8 fL (ref 80.0–100.0)
MPV: 10.6 fL (ref 7.5–12.5)
Monocytes Relative: 7.7 %
Neutro Abs: 2146 cells/uL (ref 1500–7800)
Neutrophils Relative %: 44.7 %
Platelets: 373 10*3/uL (ref 140–400)
RBC: 4.93 10*6/uL (ref 3.80–5.10)
RDW: 12.9 % (ref 11.0–15.0)
Total Lymphocyte: 37.8 %
WBC: 4.8 10*3/uL (ref 3.8–10.8)

## 2022-02-10 LAB — TSH: TSH: 1.32 mIU/L

## 2022-02-10 NOTE — Progress Notes (Signed)
Julie Reese,   Kidney, liver, glucose looks great.  Thyroid looks good.  Normal hemoglobin.  LDL, bad cholesterol, elevated but better than 1 year ago. HDL, good cholesterol, low.   Overall CV risk is still low but due to LDL being over 160 I do suggest a low dose statin to help lower LDL. Thoughts?   Marland KitchenMarland KitchenThe 10-year ASCVD risk score (Arnett DK, et al., 2019) is: 1.8%   Values used to calculate the score:     Age: 46 years     Sex: Female     Is Non-Hispanic African American: Yes     Diabetic: No     Tobacco smoker: No     Systolic Blood Pressure: 944 mmHg     Is BP treated: No     HDL Cholesterol: 39 mg/dL     Total Cholesterol: 223 mg/dL

## 2022-02-14 NOTE — Telephone Encounter (Signed)
This has been routed to Ammie to do.

## 2022-02-14 NOTE — Therapy (Signed)
OUTPATIENT PHYSICAL THERAPY LOWER EXTREMITY EVALUATION   Patient Name: Julie Reese MRN: [id number] DOB:Feb 28, 1976, 46 y.o., female Today's Date: 02/15/2022   PT End of Session - 02/15/22 1403     Visit Number 1    Number of Visits 17    Date for PT Re-Evaluation 04/12/22    Authorization Type cigna    Authorization - Visit Number 1    Authorization - Number of Visits 60   yearly   PT Start Time 1405   pt late arrival   PT Stop Time 1445    PT Time Calculation (min) 40 min    Activity Tolerance Patient tolerated treatment well    Behavior During Therapy [facility] for tasks assessed/performed             Past Medical History:  Diagnosis Date   Hyperlipidemia 06/05/2016   Obesity (BMI 30.0-34.9) 05/31/2016   Pap smear of cervix shows high risk HPV present 06/28/2016   Last pap 01/2016 with one year follow up.    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   Pap smear of cervix shows high risk HPV present 06/28/2016   Ganglion cyst of right foot 06/27/2016   Post-viral cough syndrome 06/27/2016   Hyperlipidemia 06/05/2016   Obesity (BMI 30.0-34.9) 05/31/2016   RUQ pain 05/31/2016    PCP: Donella Stade, PA-C  REFERRING PROVIDER: Bronson Ing, DPM  REFERRING DIAG: [phone] (ICD-10-CM) - Injury of right ankle, initial encounter M72.2 (ICD-10-CM) - Plantar fascial fibromatosis  THERAPY DIAG:  Pain in right ankle and  joints of right foot  Stiffness of right foot, not elsewhere classified  Muscle weakness (generalized)  Other abnormalities of gait and mobility  Rationale for Evaluation and Treatment: Rehabilitation  ONSET DATE: over summer, fell on foot out of her fiancee's truck   SUBJECTIVE:   SUBJECTIVE STATEMENT: Has increased pain with walking/WB. States symptoms have improved somewhat since onset but continues to bother her. Denies numbness, occasional tingling in posterior foot. Pt states she didn't have pain initially after injury but gradually worsened afterwards. States she initially wore a boot which helped initially but is tough to use at work, same issue with using a brace. Reports having to take increased rest breaks at work and home due to pain.    PERTINENT HISTORY: none PAIN:  Are you having pain: none Location: R heel How would you describe your pain? throbbing Best in past week: 0/10 Worst in past week: 7/10 with increased WB Aggravating factors: walking/standing, driving (using pedal), pain with getting socks/shoes on Easing factors: shoe inserts, NWB, rest   PRECAUTIONS: None  WEIGHT BEARING RESTRICTIONS: No  FALLS:  Has patient fallen in last 6 months? 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 No internal derangement of the right ankle. No acute osseous injury of the right ankle.  PATIENT SURVEYS:  FOTO: 49%, 69% predicted  COGNITION: Overall cognitive status: Within functional limits for  tasks assessed     SENSATION: WFL   PALPATION: TTP distal achilles insertion, lateral gastroc/soleus, plantar aspect of foot with some referral into toes with palpation of quadratus plantae Reduced joint mobility metatarsals 2-3, nonpainful   LOWER EXTREMITY ROM:  Active ROM Right eval Left eval  Hip flexion    Hip extension    Hip abduction    Hip adduction    Hip internal rotation    Hip external rotation    Knee flexion    Knee extension    Ankle dorsiflexion 5 12  Ankle plantarflexion 30 45  Ankle inversion 12 25  Ankle eversion 14 15   (Blank rows = not tested)  LOWER EXTREMITY MMT:  MMT Right eval Left eval  Knee flexion    Knee extension    Ankle dorsiflexion 3+ 5  Ankle plantarflexion modified sitting 3+ 5  Ankle inversion 3+ 5  Ankle eversion 4 5   (Blank rows = not tested) Comments: painful with all MMT except eversion on R foot  FUNCTIONAL TESTS:  STS from standard chair - reduced WB RLE and B UE support   GAIT: Distance walked: within clinic Assistive device utilized: None Level of assistance: Complete Independence Comments: mild antalgic gait on R, no overt instability level surface   TODAY'S TREATMENT:                                                                                                                              [facility] Adult PT Treatment:                                 DATE: 02/15/22 Therapeutic Exercise: Seated heel raise x10, education for HEP and cues for appropriate ROM Seated towel scrunch x10, cues for form, tripod foot, HEP  PATIENT EDUCATION:  Education details: Pt education on PT impairments, prognosis, and POC. Informed consent. Rationale for interventions, safe/appropriate HEP performance Person educated: Patient Education method: Explanation, Demonstration, Tactile cues, Verbal cues, and Handouts Education comprehension: verbalized understanding, returned demonstration, verbal cues required, tactile cues required, and  needs further education   HOME EXERCISE PROGRAM: Access Code: [email or web address] URL: [email or web address] Date: 02/15/2022 Prepared by: Enis Slipper  Exercises - Seated Toe Towel Scrunches  - 1 x daily - 7 x weekly - 3 sets - 10 reps - Seated Heel Raise  - 1 x daily - 7 x weekly -  3 sets - 10 reps  ASSESSMENT:  CLINICAL IMPRESSION: Pt is a 46 year old woman who arrives to PT evaluation on this date for plantar pain. Pt reports difficulty with daily/work activities due to pain. During today's session pt demonstrates limitations in R ankle mobility/strength which are limiting ability to perform aforementioned activities. Demonstrates notable TTP throughout R gastroc/soleus and achilles complex as well as throughout musculature of plantar aspect of foot, notable stiffness of metatarsal joints 2-3. Pt reports mild increase in discomfort with HEP but does not worsen with repetition, denies overt increase in pain and verbalizes good understanding of parameters for HEP. No adverse events. Recommend skilled PT to address aforementioned deficits to improve functional independence/tolerance. Pt departs today's session in no acute distress, all voiced questions/concerns addressed appropriately from PT perspective.    OBJECTIVE IMPAIRMENTS: Abnormal gait, decreased activity tolerance, decreased balance, decreased endurance, decreased mobility, difficulty walking, decreased ROM, decreased strength, hypomobility, and pain.   ACTIVITY LIMITATIONS: carrying, lifting, bending, standing, squatting, stairs, transfers, and locomotion level  PARTICIPATION LIMITATIONS: meal prep, cleaning, laundry, driving, shopping, community activity, and occupation  PERSONAL FACTORS: Profession are also affecting patient's functional outcome.   REHAB POTENTIAL: Good  CLINICAL DECISION MAKING: Stable/uncomplicated  EVALUATION COMPLEXITY: Low   GOALS: Goals reviewed with patient? No  SHORT TERM GOALS: Target  date: 03/15/2022  Pt will demonstrate appropriate understanding and performance of initially prescribed HEP in order to facilitate improved independence with management of symptoms.  Baseline: HEP provided on eval Goal status: INITIAL   2. Pt will score greater than or equal to 59% on FOTO in order to demonstrate improved perception of function due to symptoms.  Baseline: 49%  Goal status: INITIAL   LONG TERM GOALS: Target date: 04/12/2022   Pt will score 69% or greater on FOTO in order to demonstrate improved perception of function due to symptoms.  Baseline: 49% Goal status: INITIAL  2.  Pt will demonstrate symmetrical ankle DF ROM (within 2 deg) in order to facilitate improved tolerance to functional movements such as stair navigation.  Baseline: see ROM chart above Goal status: INITIAL  3.  Pt will report/demonstrate ability to stand for up to 1 hr with less than 2 pt increase in pain in order to demonstrate improved tolerance to work/daily activities.  Baseline: up to 7/10 NPS with WB Goal status: INITIAL  4.  Pt will be able to perform STS transfer without UE support, mechanics grossly WNL, for improved independence/safety w/ transfers Baseline: see above Goal status: INITIAL  5. Pt will demonstrate grossly symmetrical MMT of B feet/ankles in order to facilitate improved strength for WB activities.   Baseline: see MMT chart above  Goal status: INITIAL     PLAN:  PT FREQUENCY: 1-2x/week  PT DURATION: 8 weeks  PLANNED INTERVENTIONS: Therapeutic exercises, Therapeutic activity, Neuromuscular re-education, Balance training, Gait training, Patient/Family education, Self Care, Joint mobilization, Stair training, Orthotic/Fit training, Aquatic Therapy, Dry Needling, Electrical stimulation, Cryotherapy, Moist heat, Taping, Manual therapy, and Re-evaluation  PLAN FOR NEXT SESSION: Progress ROM/strengthening exercises as able/appropriate, review HEP.    Leeroy Cha PT,  DPT 02/15/2022 4:26 PM

## 2022-02-15 ENCOUNTER — Ambulatory Visit: Payer: Managed Care, Other (non HMO) | Attending: Podiatrist | Admitting: Physical Therapy

## 2022-02-15 ENCOUNTER — Encounter: Payer: Self-pay | Admitting: Physical Therapy

## 2022-02-15 ENCOUNTER — Telehealth: Payer: Self-pay | Admitting: *Deleted

## 2022-02-15 ENCOUNTER — Other Ambulatory Visit: Payer: Self-pay

## 2022-02-15 DIAGNOSIS — M722 Plantar fascial fibromatosis: Secondary | ICD-10-CM | POA: Insufficient documentation

## 2022-02-15 DIAGNOSIS — M25674 Stiffness of right foot, not elsewhere classified: Secondary | ICD-10-CM | POA: Diagnosis not present

## 2022-02-15 DIAGNOSIS — S99911A Unspecified injury of right ankle, initial encounter: Secondary | ICD-10-CM | POA: Diagnosis not present

## 2022-02-15 DIAGNOSIS — X58XXXA Exposure to other specified factors, initial encounter: Secondary | ICD-10-CM | POA: Insufficient documentation

## 2022-02-15 DIAGNOSIS — M6281 Muscle weakness (generalized): Secondary | ICD-10-CM | POA: Diagnosis not present

## 2022-02-15 DIAGNOSIS — R2689 Other abnormalities of gait and mobility: Secondary | ICD-10-CM | POA: Insufficient documentation

## 2022-02-15 DIAGNOSIS — M25571 Pain in right ankle and joints of right foot: Secondary | ICD-10-CM | POA: Insufficient documentation

## 2022-02-15 NOTE — Telephone Encounter (Signed)
Handicap placard and has been mailed to patient,patient aware.

## 2022-02-20 ENCOUNTER — Ambulatory Visit: Payer: Managed Care, Other (non HMO) | Admitting: Rehabilitative and Restorative Service Providers"

## 2022-02-20 ENCOUNTER — Encounter: Payer: Self-pay | Admitting: Rehabilitative and Restorative Service Providers"

## 2022-02-20 ENCOUNTER — Telehealth: Payer: Self-pay | Admitting: Podiatrist

## 2022-02-20 DIAGNOSIS — M722 Plantar fascial fibromatosis: Secondary | ICD-10-CM | POA: Diagnosis not present

## 2022-02-20 DIAGNOSIS — R2689 Other abnormalities of gait and mobility: Secondary | ICD-10-CM

## 2022-02-20 DIAGNOSIS — M25571 Pain in right ankle and joints of right foot: Secondary | ICD-10-CM

## 2022-02-20 DIAGNOSIS — M25674 Stiffness of right foot, not elsewhere classified: Secondary | ICD-10-CM

## 2022-02-20 DIAGNOSIS — M6281 Muscle weakness (generalized): Secondary | ICD-10-CM

## 2022-02-20 NOTE — Therapy (Signed)
OUTPATIENT PHYSICAL THERAPY LOWER EXTREMITY EVALUATION   Patient Name: Julie Reese MRN: [id number] DOB:03-27-1976, 46 y.o., female Today's Date: 02/20/2022   PT End of Session - 02/20/22 1539     Visit Number 2    Number of Visits 17    Date for PT Re-Evaluation 04/12/22    Authorization Type cigna    Authorization - Visit Number 2    Authorization - Number of Visits 60    PT Start Time 4098    PT Stop Time 1623    PT Time Calculation (min) 48 min    Activity Tolerance Patient tolerated treatment well             Past Medical History:  Diagnosis Date   Hyperlipidemia 06/05/2016   Obesity (BMI 30.0-34.9) 05/31/2016   Pap smear of cervix shows high risk HPV present 06/28/2016   Last pap 01/2016 with one year follow up.    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   Pap smear of cervix shows high risk HPV present 06/28/2016   Ganglion cyst of right foot 06/27/2016   Post-viral cough syndrome 06/27/2016   Hyperlipidemia 06/05/2016   Obesity (BMI 30.0-34.9) 05/31/2016   RUQ pain 05/31/2016    PCP: Donella Stade, PA-C  REFERRING PROVIDER: Bronson Ing, DPM  REFERRING DIAG: [phone] (ICD-10-CM) - Injury of right ankle, initial encounter 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 Had increased swelling after therapy while still in the sleeve. Resolved after she removed the sleeve and she had a shower. Patient reports that her foot feels a little better after treatment today. Also relates that she fractured the Rt ankle in 2009 with a long rehab following the sprain.   Has increased pain with walking/WB. States symptoms have improved somewhat since onset but continues to bother her. Denies numbness, occasional tingling in posterior foot. Pt states she didn't have pain initially after injury but gradually worsened afterwards. States she initially wore a boot which helped initially but is tough to use at work, same issue with using a brace. Reports having to take increased rest breaks at work and home due to pain.    PERTINENT HISTORY: Rt ankle fx 2009  PAIN:  Are you having pain: yes 2/10 Location: R heel How would you describe your pain? throbbing Best in past week: 0/10 Worst in past week: 5-6/10 with increased WB Aggravating factors: walking/standing, driving (using pedal), pain with getting socks/shoes on Easing factors: shoe inserts, NWB, rest  PRECAUTIONS: None  WEIGHT BEARING RESTRICTIONS: No  FALLS:  Has patient fallen in last 6 months? 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 No internal derangement of the right ankle. No acute osseous injury of the right ankle.  PATIENT SURVEYS:  FOTO: 49%, 69% predicted  COGNITION: Overall cognitive status: Within functional limits for tasks assessed     SENSATION: WFL   PALPATION: TTP distal achilles insertion, lateral gastroc/soleus, plantar aspect of foot with some referral into toes with palpation of quadratus plantae Reduced joint mobility metatarsals 2-3, nonpainful   LOWER EXTREMITY ROM:  Active ROM Right eval Left eval  Hip flexion    Hip extension    Hip abduction    Hip adduction    Hip internal rotation    Hip external rotation    Knee flexion    Knee extension    Ankle dorsiflexion 5 12  Ankle plantarflexion 30 45  Ankle inversion 12 25  Ankle eversion 14 15   (Blank rows = not tested)  LOWER EXTREMITY MMT:  MMT Right eval Left eval  Knee flexion    Knee extension    Ankle dorsiflexion 3+ 5  Ankle plantarflexion modified sitting 3+ 5  Ankle inversion 3+ 5  Ankle eversion 4 5   (Blank rows = not tested) Comments: painful with all MMT except eversion on R foot  FUNCTIONAL TESTS:  STS from standard chair - reduced WB RLE and B UE support   GAIT: Distance walked: within clinic Assistive device utilized: None Level of assistance: Complete Independence Comments: mild antalgic gait on R, no overt instability level surface   TODAY'S TREATMENT:                                                                                                                              [facility] Adult PT Treatment:                                 DATE: 02/20/22 Therapeutic Exercise:  Nustep for ROM x 5 min keeping Rt heel down Towel scrunch x 2 min  Calf stretch LE extended with towel sitting 20 sec x 3  Stretch for ankle inversion and eversion with towel 10 sec x 3 Ankle pumps x 10 x 2 set   Ankle circles CW/CCW  x 10  Ankle alphabet  Toe yoga Myofacial ball release work golf ball along plantar surface  Manual:  STM through the Rt foot and ankle. Note area of tenderness and tightness in the posterior malleolus through the anterior TFL. Worked on gentle transverse friction massage through this area as well calcaneous and talis mobs; gentle PROM through ankle. Stretching into plantar flexion stretching across the dorsum of he foot with flexion of toes all as patient tolerates. She is very sensitive to pressure and palpation.   Therapeutic Exercise: 02/15/22 Seated heel raise x10, education for HEP and cues for appropriate ROM Seated towel scrunch x10, cues for form, tripod foot, HEP  PATIENT EDUCATION:  Education details: Pt education on PT impairments, prognosis, and POC. Informed consent. Rationale for interventions, safe/appropriate HEP performance Person educated: Patient Education method: Explanation, Demonstration, Tactile cues, Verbal cues, and Handouts Education comprehension: verbalized understanding, returned demonstration, verbal cues required, tactile cues required, and needs further education   HOME EXERCISE PROGRAM: Access Code: [email or web address] URL: [email or web address] Date: 02/15/2022 Prepared by: Enis Slipper  Exercises - Seated Toe Towel Scrunches  - 1 x daily - 7 x weekly - 3 sets - 10 reps - Seated Heel Raise  - 1 x daily - 7 x weekly - 3 sets - 10 reps  HEP 02/20/22: Towel scrunch x 2 min  Calf stretch LE extended with towel sitting 20 sec x 3  Stretch for ankle inversion and eversion with towel 10 sec x 3 Ankle pumps x 10 x 2 set  Ankle circles CW/CCW  x 10  Ankle alphabet   ASSESSMENT:  CLINICAL IMPRESSION: 02/20/22: Patient returns wearing shoes with inserts and no sleeve. She has been doing exercises at home. She continues to ave pain and tightness in the Rt ankle and foot in the area of the anterior TFL. Added ROM and stretching  exercises as well as manual work. Patient reports improvement in mobility with less pain following treatment.    Eval: Pt is a 46 year old woman who arrives to PT evaluation on this date for plantar pain. Pt reports difficulty with daily/work activities due to pain. During today's session pt demonstrates limitations in R ankle mobility/strength which are limiting ability to perform aforementioned activities. Demonstrates notable TTP throughout R gastroc/soleus and achilles complex as well as throughout musculature of plantar aspect of foot, notable stiffness of metatarsal joints 2-3. Pt reports mild increase in discomfort with HEP but does not worsen with repetition, denies overt increase in pain and verbalizes good understanding of parameters for HEP. No adverse events. Recommend skilled PT to address aforementioned deficits to improve functional independence/tolerance. Pt departs today's session in no acute distress, all voiced questions/concerns addressed appropriately from PT perspective.    OBJECTIVE IMPAIRMENTS: Abnormal gait, decreased activity tolerance, decreased balance, decreased endurance, decreased mobility, difficulty walking, decreased ROM, decreased strength, hypomobility, and pain.   ACTIVITY LIMITATIONS: carrying, lifting, bending, standing, squatting, stairs, transfers, and locomotion level  PARTICIPATION LIMITATIONS: meal prep, cleaning, laundry, driving, shopping, community activity, and occupation  PERSONAL FACTORS: Profession are also affecting patient's functional outcome.   REHAB POTENTIAL: Good  CLINICAL DECISION MAKING: Stable/uncomplicated  EVALUATION COMPLEXITY: Low   GOALS: Goals reviewed with patient? No  SHORT TERM GOALS: Target date: 03/15/2022  Pt will demonstrate appropriate understanding and performance of initially prescribed HEP in order to facilitate improved independence with management of symptoms.  Baseline: HEP provided on eval Goal status: INITIAL    2. Pt will score greater than or equal to 59% on FOTO in order to demonstrate improved perception of function due to symptoms.  Baseline: 49%  Goal status: INITIAL   LONG TERM GOALS: Target date: 04/12/2022   Pt will score 69% or greater on FOTO in order to demonstrate improved perception of function due to symptoms.  Baseline: 49% Goal status: INITIAL  2.  Pt will demonstrate symmetrical ankle DF ROM (within 2 deg) in order to facilitate improved tolerance to functional movements such as stair navigation.  Baseline: see ROM chart above Goal status: INITIAL  3.  Pt will report/demonstrate ability to stand for up to 1 hr with less than 2 pt increase in pain in order to demonstrate improved tolerance to work/daily activities.  Baseline: up to 7/10 NPS with WB Goal status: INITIAL  4.  Pt will be able to perform STS transfer without UE support, mechanics grossly WNL, for improved independence/safety w/ transfers Baseline: see above Goal status: INITIAL  5. Pt will demonstrate grossly symmetrical MMT of B feet/ankles in order to facilitate improved strength for WB activities.   Baseline: see MMT chart above  Goal status: INITIAL     PLAN:  PT FREQUENCY: 1-2x/week  PT DURATION: 8 weeks  PLANNED INTERVENTIONS: Therapeutic exercises, Therapeutic activity, Neuromuscular re-education, Balance training, Gait training, Patient/Family education, Self Care, Joint mobilization, Stair training, Orthotic/Fit training, Aquatic Therapy, Dry Needling, Electrical stimulation, Cryotherapy, Moist heat, Taping, Manual therapy, and Re-evaluation  PLAN FOR NEXT SESSION: Progress ROM/strengthening exercises as indicated, consider trial of kinesotaping, progress HEP.    Barnaby Rippeon P. Helene Kelp PT, MPH 02/20/22 3:41 PM

## 2022-02-20 NOTE — Patient Instructions (Addendum)
Towel scrunch  Calf stretch LE extended with towel sitting 20 sec x 3  Stretch for ankle inversion and eversion with towel 10 sec x 3 Ankle pumps x 10 x 2 set  Ankle circles CW/CCW  x 10  Ankle alphabet  Toe yoga

## 2022-02-20 NOTE — Telephone Encounter (Signed)
Pt is asking how long does she need to be in her sneakers her job needed to know an exact time frame to be in it rather a dress shoe.  Please advise

## 2022-02-22 NOTE — Therapy (Signed)
OUTPATIENT PHYSICAL THERAPY LOWER EXTREMITY EVALUATION   Patient Name: Julie Reese MRN: [id number] DOB:11-14-1975, 46 y.o., female Today's Date: 02/22/2022     Past Medical History:  Diagnosis Date   Hyperlipidemia 06/05/2016   Obesity (BMI 30.0-34.9) 05/31/2016   Pap smear of cervix shows high risk HPV present 06/28/2016   Last pap 01/2016 with one year follow up.    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  Rationale for Evaluation and Treatment: Rehabilitation  ONSET DATE: over summer, fell on foot out of her fiancee's truck   SUBJECTIVE:   SUBJECTIVE STATEMENT: ***  Eval: Has increased pain with walking/WB. States symptoms have improved somewhat since onset but continues to bother her. Denies numbness, occasional tingling in posterior foot. Pt states she didn't have pain initially after injury but gradually worsened afterwards. States she initially  wore a boot which helped initially but is tough to use at work, same issue with using a brace. Reports having to take increased rest breaks at work and home due to pain.    PERTINENT HISTORY: Rt ankle fx 2009   PAIN: *** Are you having pain: yes 2/10 Location: R heel How would you describe your pain? throbbing Best in past week: 0/10 Worst in past week: 5-6/10 with increased WB Aggravating factors: walking/standing, driving (using pedal), pain with getting socks/shoes on Easing factors: shoe inserts, NWB, rest   PRECAUTIONS: None  WEIGHT BEARING RESTRICTIONS: No  FALLS:  Has patient fallen in last 6 months? 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 No internal derangement of the right ankle. No acute osseous injury of the right ankle.  PATIENT SURVEYS:  FOTO: 49%, 69% predicted  COGNITION: Overall cognitive status: Within functional limits for tasks assessed     SENSATION: WFL   PALPATION: TTP distal achilles insertion, lateral gastroc/soleus, plantar aspect of foot with some referral into toes with palpation of quadratus plantae Reduced joint mobility metatarsals 2-3, nonpainful   LOWER EXTREMITY ROM:  Active ROM Right eval Left eval  Hip flexion    Hip extension    Hip abduction    Hip adduction    Hip internal rotation    Hip external rotation    Knee flexion    Knee extension    Ankle dorsiflexion 5 12  Ankle plantarflexion 30 45  Ankle inversion 12 25  Ankle eversion 14 15   (Blank  rows = not tested)  LOWER EXTREMITY MMT:  MMT Right eval Left eval  Knee flexion    Knee extension    Ankle dorsiflexion 3+ 5  Ankle plantarflexion modified sitting 3+ 5  Ankle inversion 3+ 5  Ankle eversion 4 5   (Blank rows = not tested) Comments: painful with all MMT except eversion on R foot  FUNCTIONAL TESTS:  STS from standard chair - reduced WB RLE and B UE support   GAIT: Distance walked: within clinic Assistive device utilized: None Level of assistance: Complete Independence Comments: mild antalgic gait on R, no overt instability level surface   TODAY'S TREATMENT:                                                                                                                              [facility] Adult PT Treatment:                                 DATE: 02/23/22 Therapeutic Exercise:  Nustep for ROM x 5 min keeping Rt heel down ***   [facility] Adult PT Treatment:                                 DATE: 02/20/22 Therapeutic Exercise:  Nustep for ROM x 5 min keeping Rt heel down Towel scrunch x 2 min  Calf stretch LE extended with towel sitting 20 sec x 3  Stretch for ankle inversion and eversion with towel 10 sec x 3 Ankle pumps x 10 x 2 set  Ankle circles CW/CCW  x 10  Ankle alphabet  Toe yoga Myofacial ball release work golf ball along plantar surface   Manual:  STM through the Rt foot and ankle. Note area of tenderness and tightness in the posterior malleolus through the anterior TFL. Worked on gentle transverse friction massage through this area as well calcaneous and talis mobs; gentle PROM through ankle. Stretching into plantar flexion stretching across the dorsum of he foot with flexion of toes all as patient tolerates. She is very sensitive to pressure and palpation.   Therapeutic Exercise: 02/15/22 Seated heel raise x10, education for HEP and cues for appropriate ROM Seated towel scrunch x10, cues for form, tripod foot, HEP  PATIENT EDUCATION: *** Education  details: Pt education on PT impairments, prognosis, and POC. Informed consent. Rationale for interventions, safe/appropriate HEP performance Person educated: Patient Education method: Explanation, Demonstration, Tactile  cues, Verbal cues, and Handouts Education comprehension: verbalized understanding, returned demonstration, verbal cues required, tactile cues required, and needs further education   HOME EXERCISE PROGRAM: Access Code: [email or web address] URL: [email or web address] Date: 02/15/2022 Prepared by: Enis Slipper  Exercises - Seated Toe Towel Scrunches  - 1 x daily - 7 x weekly - 3 sets - 10 reps - Seated Heel Raise  - 1 x daily - 7 x weekly - 3 sets - 10 reps  HEP 02/20/22: Towel scrunch x 2 min  Calf stretch LE extended with towel sitting 20 sec x 3  Stretch for ankle inversion and eversion with towel 10 sec x 3 Ankle pumps x 10 x 2 set  Ankle circles CW/CCW  x 10  Ankle alphabet   ASSESSMENT:  CLINICAL IMPRESSION: ***  Eval: Pt is a 46 year old woman who arrives to PT evaluation on this date for plantar pain. Pt reports difficulty with daily/work activities due to pain. During today's session pt demonstrates limitations in R ankle mobility/strength which are limiting ability to perform aforementioned activities. Demonstrates notable TTP throughout R gastroc/soleus and achilles complex as well as throughout musculature of plantar aspect of foot, notable stiffness of metatarsal joints 2-3. Pt reports mild increase in discomfort with HEP but does not worsen with repetition, denies overt increase in pain and verbalizes good understanding of parameters for HEP. No adverse events. Recommend skilled PT to address aforementioned deficits to improve functional independence/tolerance. Pt departs today's session in no acute distress, all voiced questions/concerns addressed appropriately from PT perspective.    OBJECTIVE IMPAIRMENTS: Abnormal gait, decreased activity tolerance,  decreased balance, decreased endurance, decreased mobility, difficulty walking, decreased ROM, decreased strength, hypomobility, and pain.   ACTIVITY LIMITATIONS: carrying, lifting, bending, standing, squatting, stairs, transfers, and locomotion level  PARTICIPATION LIMITATIONS: meal prep, cleaning, laundry, driving, shopping, community activity, and occupation  PERSONAL FACTORS: Profession are also affecting patient's functional outcome.   REHAB POTENTIAL: Good  CLINICAL DECISION MAKING: Stable/uncomplicated  EVALUATION COMPLEXITY: Low   GOALS: Goals reviewed with patient? No  SHORT TERM GOALS: Target date: 03/15/2022  Pt will demonstrate appropriate understanding and performance of initially prescribed HEP in order to facilitate improved independence with management of symptoms.  Baseline: HEP provided on eval Goal status: INITIAL   2. Pt will score greater than or equal to 59% on FOTO in order to demonstrate improved perception of function due to symptoms.  Baseline: 49%  Goal status: INITIAL   LONG TERM GOALS: Target date: 04/12/2022   Pt will score 69% or greater on FOTO in order to demonstrate improved perception of function due to symptoms.  Baseline: 49% Goal status: INITIAL  2.  Pt will demonstrate symmetrical ankle DF ROM (within 2 deg) in order to facilitate improved tolerance to functional movements such as stair navigation.  Baseline: see ROM chart above Goal status: INITIAL  3.  Pt will report/demonstrate ability to stand for up to 1 hr with less than 2 pt increase in pain in order to demonstrate improved tolerance to work/daily activities.  Baseline: up to 7/10 NPS with WB Goal status: INITIAL  4.  Pt will be able to perform STS transfer without UE support, mechanics grossly WNL, for improved independence/safety w/ transfers Baseline: see above Goal status: INITIAL  5. Pt will demonstrate grossly symmetrical MMT of B feet/ankles in order to facilitate improved  strength for WB activities.   Baseline: see MMT chart above  Goal status: INITIAL     PLAN:  PT FREQUENCY: 1-2x/week  PT DURATION: 8 weeks  PLANNED INTERVENTIONS: Therapeutic exercises, Therapeutic activity, Neuromuscular re-education, Balance training, Gait training, Patient/Family education, Self Care, Joint mobilization, Stair training, Orthotic/Fit training, Aquatic Therapy, Dry Needling, Electrical stimulation, Cryotherapy, Moist heat, Taping, Manual therapy, and Re-evaluation  PLAN FOR NEXT SESSION: Progress ROM/strengthening exercises as indicated, consider trial of kinesotaping, progress HEP.    Celyn P. Helene Kelp PT, MPH 02/22/22 10:40 AM

## 2022-02-23 ENCOUNTER — Ambulatory Visit (INDEPENDENT_AMBULATORY_CARE_PROVIDER_SITE_OTHER): Payer: Managed Care, Other (non HMO)

## 2022-02-23 ENCOUNTER — Encounter: Payer: Self-pay | Admitting: Podiatrist

## 2022-02-23 ENCOUNTER — Ambulatory Visit: Payer: Managed Care, Other (non HMO) | Admitting: Physical Therapy

## 2022-02-23 ENCOUNTER — Encounter: Payer: Self-pay | Admitting: Physical Therapy

## 2022-02-23 DIAGNOSIS — Z1231 Encounter for screening mammogram for malignant neoplasm of breast: Secondary | ICD-10-CM | POA: Diagnosis not present

## 2022-02-23 DIAGNOSIS — M25571 Pain in right ankle and joints of right foot: Secondary | ICD-10-CM

## 2022-02-23 DIAGNOSIS — R2689 Other abnormalities of gait and mobility: Secondary | ICD-10-CM

## 2022-02-23 DIAGNOSIS — M25674 Stiffness of right foot, not elsewhere classified: Secondary | ICD-10-CM

## 2022-02-23 DIAGNOSIS — M722 Plantar fascial fibromatosis: Secondary | ICD-10-CM | POA: Diagnosis not present

## 2022-02-23 DIAGNOSIS — M6281 Muscle weakness (generalized): Secondary | ICD-10-CM

## 2022-02-28 ENCOUNTER — Encounter: Payer: Managed Care, Other (non HMO) | Admitting: Rehabilitative and Restorative Service Providers"

## 2022-02-28 NOTE — Progress Notes (Signed)
There is a possible right breast mass and more imaging is needed. [facility] should be contacting you.

## 2022-03-01 ENCOUNTER — Encounter: Payer: Self-pay | Admitting: Rehabilitative and Restorative Service Providers"

## 2022-03-01 ENCOUNTER — Ambulatory Visit: Payer: Managed Care, Other (non HMO) | Admitting: Rehabilitative and Restorative Service Providers"

## 2022-03-01 ENCOUNTER — Other Ambulatory Visit: Payer: Self-pay | Admitting: Physician Assistant

## 2022-03-01 DIAGNOSIS — R2689 Other abnormalities of gait and mobility: Secondary | ICD-10-CM

## 2022-03-01 DIAGNOSIS — M25674 Stiffness of right foot, not elsewhere classified: Secondary | ICD-10-CM

## 2022-03-01 DIAGNOSIS — M25571 Pain in right ankle and joints of right foot: Secondary | ICD-10-CM

## 2022-03-01 DIAGNOSIS — M722 Plantar fascial fibromatosis: Secondary | ICD-10-CM | POA: Diagnosis not present

## 2022-03-01 DIAGNOSIS — R928 Other abnormal and inconclusive findings on diagnostic imaging of breast: Secondary | ICD-10-CM

## 2022-03-01 DIAGNOSIS — M6281 Muscle weakness (generalized): Secondary | ICD-10-CM

## 2022-03-01 NOTE — Therapy (Signed)
++++ OUTPATIENT PHYSICAL THERAPY LOWER EXTREMITY EVALUATION   Patient Name: Julie Reese MRN: [id number] DOB:December 14, 1975, 46 y.o., female Today's Date: 03/01/2022   PT End of Session - 03/01/22 0902     Visit Number 4    Number of Visits 17    Date for PT Re-Evaluation 04/12/22    Authorization Type cigna    Authorization - Visit Number 4    Authorization - Number of Visits 60    PT Start Time 0900    PT Stop Time 0945    PT Time Calculation (min) 45 min    Activity Tolerance Patient tolerated treatment well              Past Medical History:  Diagnosis Date   Hyperlipidemia 06/05/2016   Obesity (BMI 30.0-34.9) 05/31/2016   Pap smear of cervix shows high risk HPV present 06/28/2016   Last pap 01/2016 with one year follow up.    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   Pap smear of cervix shows high risk HPV present 06/28/2016   Ganglion cyst of right foot 06/27/2016   Post-viral cough syndrome 06/27/2016   Hyperlipidemia 06/05/2016   Obesity (BMI 30.0-34.9) 05/31/2016   RUQ pain 05/31/2016    PCP: Donella Stade, PA-C  REFERRING PROVIDER: Bronson Ing, DPM  REFERRING DIAG: [phone] (ICD-10-CM) - Injury of right ankle, initial encounter M72.2 (ICD-10-CM) - Plantar fascial fibromatosis  THERAPY DIAG:  Pain in right ankle and joints of right foot  Stiffness of right foot, not elsewhere  classified  Muscle weakness (generalized)  Other abnormalities of gait and mobility  Rationale for Evaluation and Treatment: Rehabilitation  ONSET DATE: over summer, fell on foot out of her fiancee's truck   SUBJECTIVE:   SUBJECTIVE STATEMENT: 03/01/22: Foot and ankle are improving. She is working on her exercises at home. She still having pain with braking. Patient reports some popping when she is walking at times. Has shoes that offer good support.   Eval: Has increased pain with walking/WB. States symptoms have improved somewhat since onset but continues to bother her. Denies numbness, occasional tingling in posterior foot. Pt states she didn't have pain initially after injury but gradually worsened afterwards. States she initially wore a boot which helped initially but is tough to use at work, same issue with using a brace. Reports having to take increased rest breaks at work and home due to pain.    PERTINENT HISTORY: Rt ankle fx 2009   PAIN:  Are you having pain: yes 0/10 Location: R heel How would you describe your pain? throbbing Best in past week: 0/10 Worst in past week: 5-6/10 with increased WB Aggravating factors: walking/standing, driving (using pedal), pain with getting socks/shoes on Easing factors: shoe inserts, NWB, rest   PRECAUTIONS: None  WEIGHT BEARING RESTRICTIONS: No  FALLS:  Has patient fallen in last 6 months? 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 No internal derangement of the  right ankle. No acute osseous injury of the right ankle.  PATIENT SURVEYS:  FOTO: 49%, 69% predicted  COGNITION: Overall cognitive status: Within functional limits for tasks assessed     SENSATION: WFL   PALPATION: TTP distal achilles insertion, lateral gastroc/soleus, plantar aspect of foot with some referral into toes with palpation of quadratus plantae Reduced joint mobility metatarsals 2-3, nonpainful   LOWER EXTREMITY ROM:  Active ROM Right eval Left eval  Hip flexion    Hip extension    Hip abduction    Hip adduction    Hip internal rotation    Hip external rotation    Knee flexion    Knee extension    Ankle dorsiflexion 5 12  Ankle plantarflexion 30 45  Ankle inversion 12 25  Ankle eversion 14 15   (Blank rows = not tested)  LOWER EXTREMITY MMT:  MMT Right eval Left eval  Knee flexion    Knee extension    Ankle dorsiflexion 3+ 5  Ankle plantarflexion modified sitting 3+ 5  Ankle inversion 3+ 5  Ankle eversion 4 5   (Blank rows = not tested) Comments: painful with all MMT except eversion on R foot  FUNCTIONAL TESTS:  STS from standard chair - reduced WB RLE and B UE support   GAIT: Distance walked: within clinic Assistive device utilized: None Level of assistance: Complete Independence Comments: mild antalgic gait on R, no overt instability level surface   TODAY'S TREATMENT:                                                                                                                              [facility] Adult PT Treatment:  Date: 03/01/22: Therapeutic Exercise:  Nustep for ROM x 5.5 min keeping Rt heel down Towel scrunch x 2 min Arch lift 3 sec hold x 10 sitting Toe yoga x 10  Seated calf stretch sliding heel back 3x30 sec Calf stretch LE extended with towel sitting 30 sec x 3 Stretch for ankle inversion and eversion with towel 10 sec x 2 AROM x 10 for inv/ever Ankle pumps x 15 Circles x 10 CW/CCW  Manual: STM to R gastroc/soleus and  extensor digitorum brevis Transverse friction massage along lateral borders of achilles and around lateral malleolus  DATE: 02/23/22 Therapeutic Exercise:  Nustep for ROM x 5.5 min keeping Rt heel down Towel scrunch x 2 min Seated calf stretch sliding heel back 3x30 sec Calf  stretch LE extended with towel sitting 30 sec x 3 Stretch for ankle inversion and eversion with towel 10 sec x 2 AROM x 10 for inv/ever Ankle pumps x 15 Circles x 10 CW/CCW  Manual: STM to R gastroc/soleus and extensor digitorum brevis Transverse friction massage along lateral borders of achilles and around lateral malleolus  Modalities: Iontophoresis Rt lateral malleolus 1 cc 80 mAmp dose x 8 hours    [facility] Adult PT Treatment:                                 DATE: 02/20/22 Therapeutic Exercise:  Nustep for ROM x 5 min keeping Rt heel down Towel scrunch x 2 min  Calf stretch LE extended with towel sitting 20 sec x 3  Stretch for ankle inversion and eversion with towel 10 sec x 3 Ankle pumps x 10 x 2 set  Ankle circles CW/CCW  x 10  Ankle alphabet  Toe yoga Myofacial ball release work golf ball along plantar surface   Manual:  STM through the Rt foot and ankle. Note area of tenderness and tightness in the posterior malleolus through the anterior TFL. Worked on gentle transverse friction massage through this area as well calcaneous and talis mobs; gentle PROM through ankle. Stretching into plantar flexion stretching across the dorsum of he foot with flexion of toes all as patient tolerates. She is very sensitive to pressure and palpation.   PATIENT EDUCATION:  Education details: Pt education on DN and iontophoresis with handouts Person educated: Patient Education method: Explanation, Demonstration, Tactile cues, Verbal cues, and Handouts Education comprehension: verbalized understanding, returned demonstration, verbal cues required, tactile cues required, and needs further education   HOME EXERCISE  PROGRAM: Access Code: [email or web address] URL: [email or web address] Date: 03/01/2022 Prepared by: Gillermo Murdoch  Exercises - Seated Toe Towel Scrunches  - 1 x daily - 7 x weekly - 3 sets - 10 reps - Seated Heel Raise  - 1 x daily - 7 x weekly - 3 sets - 10 reps - Seated Arch Lifts  - 2 x daily - 7 x weekly - 1 sets - 3 reps - 30 sec  hold  Patient Education - Trigger Point Dry Needling - Ionto Patient Instructions  ASSESSMENT:  CLINICAL IMPRESSION: 03/01/22: Some improvement in the Rt foot and ankle. Patient is working on [employer]. Continued exercise, manual work. Trial of ionto in area of lateral malleolus.   Julie Reese continues to be very painful with ROM particularly eversion. She demonstrates edema just lateral to achilles tendon. She also has trigger points in her gastroc/soleus and extensor digitorum brevis. She would likely benefit from a trial of both DN and iontophoresis. Request to add ionto to POC sent today. Chimamanda continues to demonstrate potential for improvement and would benefit from continued skilled therapy to address impairments.     OBJECTIVE IMPAIRMENTS: Abnormal gait, decreased activity tolerance, decreased balance, decreased endurance, decreased mobility, difficulty walking, decreased ROM, decreased strength, hypomobility, and pain.   ACTIVITY LIMITATIONS: carrying, lifting, bending, standing, squatting, stairs, transfers, and locomotion level  PARTICIPATION LIMITATIONS: meal prep, cleaning, laundry, driving, shopping, community activity, and occupation  PERSONAL FACTORS: Profession are also affecting patient's functional outcome.   REHAB POTENTIAL: Good  CLINICAL DECISION MAKING: Stable/uncomplicated  EVALUATION COMPLEXITY: Low   GOALS: Goals reviewed with patient? No  SHORT TERM GOALS: Target date: 03/15/2022  Pt will demonstrate appropriate understanding and performance of initially prescribed HEP in order to  facilitate improved independence with  management of symptoms.  Baseline: HEP provided on eval Goal status: INITIAL   2. Pt will score greater than or equal to 59% on FOTO in order to demonstrate improved perception of function due to symptoms.  Baseline: 49%  Goal status: INITIAL   LONG TERM GOALS: Target date: 04/12/2022   Pt will score 69% or greater on FOTO in order to demonstrate improved perception of function due to symptoms.  Baseline: 49% Goal status: INITIAL  2.  Pt will demonstrate symmetrical ankle DF ROM (within 2 deg) in order to facilitate improved tolerance to functional movements such as stair navigation.  Baseline: see ROM chart above Goal status: INITIAL  3.  Pt will report/demonstrate ability to stand for up to 1 hr with less than 2 pt increase in pain in order to demonstrate improved tolerance to work/daily activities.  Baseline: up to 7/10 NPS with WB Goal status: INITIAL  4.  Pt will be able to perform STS transfer without UE support, mechanics grossly WNL, for improved independence/safety w/ transfers Baseline: see above Goal status: INITIAL  5. Pt will demonstrate grossly symmetrical MMT of B feet/ankles in order to facilitate improved strength for WB activities.   Baseline: see MMT chart above  Goal status: INITIAL   PLAN:  PT FREQUENCY: 1-2x/week  PT DURATION: 8 weeks  PLANNED INTERVENTIONS: Therapeutic exercises, Therapeutic activity, Neuromuscular re-education, Balance training, Gait training, Patient/Family education, Self Care, Joint mobilization, Stair training, Orthotic/Fit training, Aquatic Therapy, Dry Needling, Electrical stimulation, Cryotherapy, Moist heat, Taping, Ionotophoresis '4mg'$ /ml Dexamethasone, Manual therapy, and Re-evaluation  PLAN FOR NEXT SESSION: Trial of DN and ionto if signed. Progress ROM/strengthening exercises as indicated, consider trial of kinesotaping, progress HEP.   Julie Reese P. Helene Kelp PT, MPH 03/01/22 9:52 AM

## 2022-03-06 ENCOUNTER — Encounter: Payer: Self-pay | Admitting: Physical Therapy

## 2022-03-06 ENCOUNTER — Ambulatory Visit: Payer: Managed Care, Other (non HMO) | Admitting: Physical Therapy

## 2022-03-06 DIAGNOSIS — M6281 Muscle weakness (generalized): Secondary | ICD-10-CM

## 2022-03-06 DIAGNOSIS — M25571 Pain in right ankle and joints of right foot: Secondary | ICD-10-CM

## 2022-03-06 DIAGNOSIS — M722 Plantar fascial fibromatosis: Secondary | ICD-10-CM | POA: Diagnosis not present

## 2022-03-06 DIAGNOSIS — M25674 Stiffness of right foot, not elsewhere classified: Secondary | ICD-10-CM

## 2022-03-06 DIAGNOSIS — R2689 Other abnormalities of gait and mobility: Secondary | ICD-10-CM

## 2022-03-06 NOTE — Therapy (Signed)
OUTPATIENT PHYSICAL THERAPY TREATMENT   Patient Name: Julie Reese MRN: [id number] DOB:Mar 02, 1976, 46 y.o., female Today's Date: 03/06/2022   PT End of Session - 03/06/22 0724     Visit Number 5    Number of Visits 17    Date for PT Re-Evaluation 04/12/22    Authorization Type cigna    Authorization - Number of Visits 60    PT Start Time [phone]    PT Stop Time 0800    PT Time Calculation (min) 36 min    Activity Tolerance Patient tolerated treatment well    Behavior During Therapy [facility] for tasks assessed/performed               Past Medical History:  Diagnosis Date   Hyperlipidemia 06/05/2016   Obesity (BMI 30.0-34.9) 05/31/2016   Pap smear of cervix shows high risk HPV present 06/28/2016   Last pap 01/2016 with one year follow up.    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   Pap smear of cervix shows high risk HPV present 06/28/2016   Ganglion cyst of right foot 06/27/2016   Post-viral cough syndrome 06/27/2016   Hyperlipidemia 06/05/2016   Obesity (BMI 30.0-34.9) 05/31/2016   RUQ pain 05/31/2016    PCP: Donella Stade, PA-C  REFERRING PROVIDER: Bronson Ing, DPM  REFERRING DIAG: [phone] (ICD-10-CM) - Injury of right ankle, initial encounter M72.2 (ICD-10-CM) - Plantar fascial fibromatosis  THERAPY DIAG:  Pain in right ankle and joints of right foot  Stiffness of right foot, not elsewhere  classified  Muscle weakness (generalized)  Other abnormalities of gait and mobility  Rationale for Evaluation and Treatment: Rehabilitation  ONSET DATE: over summer, fell on foot out of her fiancee's truck   SUBJECTIVE:   SUBJECTIVE STATEMENT: 11/27: pt states she's been working on the heel/toe walking. Pt reports the ionto patch worked really well.   Eval: Has increased pain with walking/WB. States symptoms have improved somewhat since onset but continues to bother her. Denies numbness, occasional tingling in posterior foot. Pt states she didn't have pain initially after injury but gradually worsened afterwards. States she initially wore a boot which helped initially but is tough to use at work, same issue with using a brace. Reports having to take increased rest breaks at work and home due to pain.    PERTINENT HISTORY: Rt ankle fx 2009   PAIN:  Are you having pain: yes 0/10 Location: R heel How would you describe your pain? throbbing Best in past week: 0/10 Worst in past week: 5-6/10 with increased WB Aggravating factors: walking/standing, driving (using pedal), pain with getting socks/shoes on Easing factors: shoe inserts, NWB, rest   PRECAUTIONS: None  WEIGHT BEARING RESTRICTIONS: No  FALLS:  Has patient fallen in last 6 months? 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  PLOF: Independent  PATIENT GOALS: reduce pain, walk better, wants to get back to jogging/running    OBJECTIVE:   PATIENT SURVEYS:  FOTO: 49%, 69% predicted  PALPATION: TTP distal achilles insertion, lateral gastroc/soleus, plantar aspect of foot with some referral into toes with palpation of quadratus plantae Reduced joint mobility metatarsals 2-3,  nonpainful   LOWER EXTREMITY ROM:  Active ROM Right eval Left eval  Hip flexion    Hip extension    Hip abduction    Hip adduction    Hip internal rotation    Hip external rotation    Knee flexion    Knee extension    Ankle dorsiflexion 5 12  Ankle plantarflexion 30 45  Ankle inversion 12 25  Ankle eversion 14 15   (Blank rows = not tested)  LOWER EXTREMITY MMT:  MMT Right eval Left eval  Knee flexion    Knee extension    Ankle dorsiflexion 3+ 5  Ankle plantarflexion modified sitting 3+ 5  Ankle inversion 3+ 5  Ankle eversion 4 5   (Blank rows = not tested) Comments: painful with all MMT except eversion on R foot  FUNCTIONAL TESTS:  STS from standard chair - reduced WB RLE and B UE support   GAIT: Distance walked: within clinic Assistive device utilized: None Level of assistance: Complete Independence Comments: mild antalgic gait on R, no overt instability level surface   TODAY'S TREATMENT:                                                                                                                              [facility] Adult PT Treatment:  Date: 03/06/22: Therapeutic Exercise: Nustep L5 x 5 min Gastroc stretch seated with strap 2x30 sec Seated Heel slide x15 Seated Heel raise 2x10 Seated Ankle inv iso 2x 10 x 3 sec Seated ankle ev iso 10 x 3 sec  Manual: STM to R peroneals Gentle grade II to III PA mobs to metatarsals and ankle distraction K tape for peroneals   Date: 03/01/22: Therapeutic Exercise:  Nustep for ROM x 5.5 min keeping Rt heel down Towel scrunch x 2 min Arch lift 3 sec hold x 10 sitting Toe yoga x 10  Seated calf stretch sliding heel back 3x30 sec Calf stretch LE extended with towel sitting 30 sec x 3 Stretch for ankle inversion and eversion with towel 10 sec x 2 AROM x 10 for inv/ever Ankle pumps x 15 Circles x 10 CW/CCW  Manual: STM to R gastroc/soleus and extensor digitorum brevis Transverse friction massage along lateral  borders of achilles and around lateral malleolus  DATE: 02/23/22 Therapeutic Exercise:  Nustep for ROM x 5.5 min keeping Rt heel down Towel scrunch x 2 min Seated calf stretch sliding heel back 3x30 sec Calf stretch LE extended  with towel sitting 30 sec x 3 Stretch for ankle inversion and eversion with towel 10 sec x 2 AROM x 10 for inv/ever Ankle pumps x 15 Circles x 10 CW/CCW  Manual: STM to R gastroc/soleus and extensor digitorum brevis Transverse friction massage along lateral borders of achilles and around lateral malleolus  Modalities: Iontophoresis Rt lateral malleolus 1 cc 80 mAmp dose x 8 hours    [facility] Adult PT Treatment:                                 DATE: 02/20/22 Therapeutic Exercise:  Nustep for ROM x 5 min keeping Rt heel down Towel scrunch x 2 min  Calf stretch LE extended with towel sitting 20 sec x 3  Stretch for ankle inversion and eversion with towel 10 sec x 3 Ankle pumps x 10 x 2 set  Ankle circles CW/CCW  x 10  Ankle alphabet  Toe yoga Myofacial ball release work golf ball along plantar surface   Manual:  STM through the Rt foot and ankle. Note area of tenderness and tightness in the posterior malleolus through the anterior TFL. Worked on gentle transverse friction massage through this area as well calcaneous and talis mobs; gentle PROM through ankle. Stretching into plantar flexion stretching across the dorsum of he foot with flexion of toes all as patient tolerates. She is very sensitive to pressure and palpation.   PATIENT EDUCATION:  Education details: Pt education on DN and iontophoresis with handouts Person educated: Patient Education method: Explanation, Demonstration, Tactile cues, Verbal cues, and Handouts Education comprehension: verbalized understanding, returned demonstration, verbal cues required, tactile cues required, and needs further education   HOME EXERCISE PROGRAM: Access Code: [email or web address] URL:  [email or web address] Date: 03/01/2022 Prepared by: Gillermo Murdoch  Exercises - Seated Toe Towel Scrunches  - 1 x daily - 7 x weekly - 3 sets - 10 reps - Seated Heel Raise  - 1 x daily - 7 x weekly - 3 sets - 10 reps - Seated Arch Lifts  - 2 x daily - 7 x weekly - 1 sets - 3 reps - 30 sec  hold  Patient Education - Trigger Point Dry Needling - [place] Patient Instructions  ASSESSMENT:  CLINICAL IMPRESSION: 03/06/22: Continued to work on ankle mobility. Initiated gentle isometrics. Difficulty tolerating ankle eversion. Performed trial of ktape to support peroneals.    OBJECTIVE IMPAIRMENTS: Abnormal gait, decreased activity tolerance, decreased balance, decreased endurance, decreased mobility, difficulty walking, decreased ROM, decreased strength, hypomobility, and pain.   ACTIVITY LIMITATIONS: carrying, lifting, bending, standing, squatting, stairs, transfers, and locomotion level  PARTICIPATION LIMITATIONS: meal prep, cleaning, laundry, driving, shopping, community activity, and occupation  PERSONAL FACTORS: Profession are also affecting patient's functional outcome.   REHAB POTENTIAL: Good  CLINICAL DECISION MAKING: Stable/uncomplicated  EVALUATION COMPLEXITY: Low   GOALS: Goals reviewed with patient? No  SHORT TERM GOALS: Target date: 03/15/2022  Pt will demonstrate appropriate understanding and performance of initially prescribed HEP in order to facilitate improved independence with management of symptoms.  Baseline: HEP provided on eval Goal status: INITIAL   2. Pt will score greater than or equal to 59% on FOTO in order to demonstrate improved perception of function due to symptoms.  Baseline: 49%  Goal status: INITIAL   LONG TERM GOALS: Target date: 04/12/2022   Pt will score 69% or greater on FOTO in order to demonstrate improved perception of function due to symptoms.  Baseline: 49% Goal status: INITIAL  2.  Pt will demonstrate symmetrical ankle DF  ROM (within 2 deg) in order to facilitate improved tolerance to functional movements such as stair navigation.  Baseline: see ROM chart above Goal status: INITIAL  3.  Pt will report/demonstrate ability to stand for up to 1 hr with less than 2 pt increase in pain in order to demonstrate improved tolerance to work/daily activities.  Baseline: up to 7/10 NPS with WB Goal status: INITIAL  4.  Pt will be able to perform STS transfer without UE support, mechanics grossly WNL, for improved independence/safety w/ transfers Baseline: see above Goal status: INITIAL  5. Pt will demonstrate grossly symmetrical MMT of B feet/ankles in order to facilitate improved strength for WB activities.   Baseline: see MMT chart above  Goal status: INITIAL   PLAN:  PT FREQUENCY: 1-2x/week  PT DURATION: 8 weeks  PLANNED INTERVENTIONS: Therapeutic exercises, Therapeutic activity, Neuromuscular re-education, Balance training, Gait training, Patient/Family education, Self Care, Joint mobilization, Stair training, Orthotic/Fit training, Aquatic Therapy, Dry Needling, Electrical stimulation, Cryotherapy, Moist heat, Taping, Ionotophoresis '4mg'$ /ml Dexamethasone, Manual therapy, and Re-evaluation  PLAN FOR NEXT SESSION: Trial of DN and ionto if signed. Progress ROM/strengthening exercises as indicated, consider trial of kinesotaping, progress HEP.   Yanni Ruberg April Ma L Wilfred Dayrit, PT, DPT 03/06/22 7:24 AM

## 2022-03-08 ENCOUNTER — Ambulatory Visit: Payer: Managed Care, Other (non HMO) | Admitting: Physical Therapy

## 2022-03-16 ENCOUNTER — Ambulatory Visit
Admission: RE | Admit: 2022-03-16 | Discharge: 2022-03-16 | Disposition: A | Discharge status: Home / Self Care | Payer: Managed Care, Other (non HMO) | Source: Ambulatory Visit | Attending: Physician Assistant | Admitting: Physician Assistant

## 2022-03-16 ENCOUNTER — Encounter: Payer: Self-pay | Admitting: Physical Therapy

## 2022-03-16 ENCOUNTER — Ambulatory Visit: Payer: Managed Care, Other (non HMO) | Attending: Podiatrist | Admitting: Physical Therapy

## 2022-03-16 DIAGNOSIS — M6281 Muscle weakness (generalized): Secondary | ICD-10-CM

## 2022-03-16 DIAGNOSIS — R928 Other abnormal and inconclusive findings on diagnostic imaging of breast: Secondary | ICD-10-CM

## 2022-03-16 DIAGNOSIS — M25674 Stiffness of right foot, not elsewhere classified: Secondary | ICD-10-CM | POA: Diagnosis present

## 2022-03-16 DIAGNOSIS — R2689 Other abnormalities of gait and mobility: Secondary | ICD-10-CM

## 2022-03-16 DIAGNOSIS — M25571 Pain in right ankle and joints of right foot: Secondary | ICD-10-CM

## 2022-03-16 NOTE — Therapy (Signed)
OUTPATIENT PHYSICAL THERAPY TREATMENT   Patient Name: RAYNESHA TIEDT MRN: [id number] DOB:1976/01/28, 46 y.o., female Today's Date: 03/16/2022   PT End of Session - 03/16/22 0934     Visit Number 6    Number of Visits 17    Date for PT Re-Evaluation 04/12/22    Authorization Type cigna    Authorization - Number of Visits 60    PT Start Time [phone]    PT Stop Time 1015    PT Time Calculation (min) 41 min    Activity Tolerance Patient tolerated treatment well    Behavior During Therapy [facility] for tasks assessed/performed               Past Medical History:  Diagnosis Date   Hyperlipidemia 06/05/2016   Obesity (BMI 30.0-34.9) 05/31/2016   Pap smear of cervix shows high risk HPV present 06/28/2016   Last pap 01/2016 with one year follow up.    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   Pap smear of cervix shows high risk HPV present 06/28/2016   Ganglion cyst of right foot 06/27/2016   Post-viral cough syndrome 06/27/2016   Hyperlipidemia 06/05/2016   Obesity (BMI 30.0-34.9) 05/31/2016   RUQ pain 05/31/2016    PCP: Donella Stade, PA-C  REFERRING PROVIDER: Bronson Ing, DPM  REFERRING DIAG: [phone] (ICD-10-CM) - Injury of right ankle, initial encounter M72.2 (ICD-10-CM) - Plantar fascial fibromatosis  THERAPY DIAG:  Pain in right ankle and joints of right foot  Stiffness of right foot, not elsewhere  classified  Other abnormalities of gait and mobility  Muscle weakness (generalized)  Rationale for Evaluation and Treatment: Rehabilitation  ONSET DATE: over summer, fell on foot out of her fiancee's truck   SUBJECTIVE:   SUBJECTIVE STATEMENT: 12/7: pt states k tape didn't work as well as ionto. Pt has noted less pain in her foot/ankle and more in the side of the calf. Pt reports she has been working on her walking.   Eval: Has increased pain with walking/WB. States symptoms have improved somewhat since onset but continues to bother her. Denies numbness, occasional tingling in posterior foot. Pt states she didn't have pain initially after injury but gradually worsened afterwards. States she initially wore a boot which helped initially but is tough to use at work, same issue with using a brace. Reports having to take increased rest breaks at work and home due to pain.    PERTINENT HISTORY: Rt ankle fx 2009   PAIN:  Are you having pain: 0/10 Location: R heel How would you describe your pain? throbbing Best in past week: 0/10 Worst in past week: 5-6/10 with increased WB Aggravating factors: walking/standing, driving (using pedal), pain with getting socks/shoes on Easing factors: shoe inserts, NWB, rest   PRECAUTIONS: None  WEIGHT BEARING RESTRICTIONS: No  FALLS:  Has patient fallen in last 6 months? 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  PLOF: Independent  PATIENT GOALS: reduce pain, walk better, wants to get back to jogging/running    OBJECTIVE:   PATIENT SURVEYS:  FOTO: 49%, 69% predicted FOTO: 56% (03/16/22)  PALPATION: TTP distal achilles insertion, lateral gastroc/soleus, plantar aspect of foot with some referral  into toes with palpation of quadratus plantae Reduced joint mobility metatarsals 2-3, nonpainful   LOWER EXTREMITY ROM:  Active ROM Right eval Left eval  Hip flexion    Hip extension    Hip abduction    Hip adduction    Hip internal rotation    Hip external rotation    Knee flexion    Knee extension    Ankle dorsiflexion 5 12  Ankle plantarflexion 30 45  Ankle inversion 12 25  Ankle eversion 14 15   (Blank rows = not tested)  LOWER EXTREMITY MMT:  MMT Right eval Left eval  Knee flexion    Knee extension    Ankle dorsiflexion 3+ 5  Ankle plantarflexion modified sitting 3+ 5  Ankle inversion 3+ 5  Ankle eversion 4 5   (Blank rows = not tested) Comments: painful with all MMT except eversion on R foot  FUNCTIONAL TESTS:  STS from standard chair - reduced WB RLE and B UE support   GAIT: Distance walked: within clinic Assistive device utilized: None Level of assistance: Complete Independence Comments: mild antalgic gait on R, no overt instability level surface   TODAY'S TREATMENT:                                                                                                                              [facility] Adult PT Treatment:                                                DATE: 03/16/22 Therapeutic Exercise: Nustep L5 x 5 min Gastroc stretch standing on slant board 2x 30 sec Soleus stretch standing on slant board 2x 30 sec Sitting Ankle ev yellow TB 2x10 Ankle inv yellow TB 2x10 Ankle DF yellow TB 2x10 Heel raise 2x10 Manual Therapy: STM, IASTM, and TPR gastroc/soleus and peroneals Skilled assessment and palpation for TPDN Trigger Point Dry-Needling  Treatment instructions: Expect mild to moderate muscle soreness. S/S of pneumothorax if dry needled over a lung field, and to seek immediate medical attention should they occur. Patient verbalized understanding of these instructions and education.  Patient Consent Given: Yes Education handout provided:  Previously provided Muscles treated: R peroneals Electrical stimulation performed: No Parameters: N/A Treatment response/outcome: Twitch response ilicited, palpable increase in muscle length, decreased  pain Modalities: Vasopneumatic around ankle, 34 deg, low pressure x 10 min   [facility] Adult PT Treatment:  Date: 03/06/22: Therapeutic Exercise: Nustep L5 x 5 min Gastroc stretch seated with strap 2x30 sec Seated Heel slide x15 Seated Heel raise 2x10 Seated Ankle inv iso 2x 10 x 3 sec Seated ankle ev iso 10 x 3 sec  Manual: STM to R peroneals Gentle grade II to III PA mobs to metatarsals and ankle distraction K tape for peroneals   PATIENT EDUCATION:  Education details: Pt education on DN and iontophoresis with handouts Person educated: Patient Education method: Consulting civil engineer, Demonstration, Corporate treasurer cues, Verbal cues, and Handouts Education comprehension: verbalized understanding, returned demonstration, verbal cues required, tactile cues required, and needs further education   HOME EXERCISE PROGRAM: Access Code: [email or web address] URL: [email or web address] Date: 03/16/2022 Prepared by: Estill Bamberg April Thurnell Garbe  Exercises - Seated Toe Towel Scrunches  - 1 x daily - 7 x weekly - 3 sets - 10 reps - Seated Arch Lifts  - 2 x daily - 7 x weekly - 1 sets - 3 reps - 30 sec  hold - Seated Ankle Eversion with Resistance (Mirrored)  - 1 x daily - 7 x weekly - 2 sets - 10 reps - CLX Ankle Dorsiflexion and Eversion  - 1 x daily - 7 x weekly - 2 sets - 10 reps - Seated Ankle Inversion with Resistance and Legs Crossed  - 1 x daily - 7 x weekly - 2 sets - 10 reps - Seated Heel Raise  - 1 x daily - 7 x weekly - 2 sets - 10 reps  ASSESSMENT:  CLINICAL IMPRESSION: Treatment focused on progressing ankle strengthening. More pain felt in muscle belly of R peroneals -- provided manual work and TPDN to address. Able to tolerate use of yellow TB.    OBJECTIVE IMPAIRMENTS: Abnormal gait,  decreased activity tolerance, decreased balance, decreased endurance, decreased mobility, difficulty walking, decreased ROM, decreased strength, hypomobility, and pain.   ACTIVITY LIMITATIONS: carrying, lifting, bending, standing, squatting, stairs, transfers, and locomotion level  PARTICIPATION LIMITATIONS: meal prep, cleaning, laundry, driving, shopping, community activity, and occupation  PERSONAL FACTORS: Profession are also affecting patient's functional outcome.   REHAB POTENTIAL: Good  CLINICAL DECISION MAKING: Stable/uncomplicated  EVALUATION COMPLEXITY: Low   GOALS: Goals reviewed with patient? No  SHORT TERM GOALS: Target date: 03/15/2022  Pt will demonstrate appropriate understanding and performance of initially prescribed HEP in order to facilitate improved independence with management of symptoms.  Baseline: has been performing Goal status: MET  2. Pt will score greater than or equal to 59% on FOTO in order to demonstrate improved perception of function due to symptoms.  Baseline: 56% (03/16/22)  Goal status: IN PROGRESS  LONG TERM GOALS: Target date: 04/12/2022   Pt will score 69% or greater on FOTO in order to demonstrate improved perception of function due to symptoms.  Baseline: 49% Goal status: INITIAL  2.  Pt will demonstrate symmetrical ankle DF ROM (within 2 deg) in order to facilitate improved tolerance to functional movements such as stair navigation.  Baseline: see ROM chart above Goal status: INITIAL  3.  Pt will report/demonstrate ability to stand for up to 1 hr with less than 2 pt increase in pain in order to demonstrate improved tolerance to work/daily activities.  Baseline: up to 7/10 NPS with WB Goal status: INITIAL  4.  Pt will be able to perform STS transfer without UE support, mechanics grossly WNL, for improved independence/safety w/ transfers Baseline:  see above Goal status: INITIAL  5. Pt will demonstrate grossly symmetrical MMT of B  feet/ankles in order to facilitate improved strength for WB activities.   Baseline: see MMT chart above  Goal status: INITIAL   PLAN:  PT FREQUENCY: 1-2x/week  PT DURATION: 8 weeks  PLANNED INTERVENTIONS: Therapeutic exercises, Therapeutic activity, Neuromuscular re-education, Balance training, Gait training, Patient/Family education, Self Care, Joint mobilization, Stair training, Orthotic/Fit training, Aquatic Therapy, Dry Needling, Electrical stimulation, Cryotherapy, Moist heat, Taping, Ionotophoresis 54m/ml Dexamethasone, Manual therapy, and Re-evaluation  PLAN FOR NEXT SESSION: Progress ROM/strengthening exercises as indicated, consider trial of kinesotaping, progress HEP.   Michie Molnar April Ma L Shanan Fitzpatrick, PT, DPT 03/16/22 9:35 AM

## 2022-03-22 ENCOUNTER — Encounter: Payer: Self-pay | Admitting: Physical Therapy

## 2022-03-22 ENCOUNTER — Ambulatory Visit: Payer: Managed Care, Other (non HMO) | Admitting: Physical Therapy

## 2022-03-22 DIAGNOSIS — R2689 Other abnormalities of gait and mobility: Secondary | ICD-10-CM

## 2022-03-22 DIAGNOSIS — M6281 Muscle weakness (generalized): Secondary | ICD-10-CM

## 2022-03-22 DIAGNOSIS — M25571 Pain in right ankle and joints of right foot: Secondary | ICD-10-CM

## 2022-03-22 DIAGNOSIS — M25674 Stiffness of right foot, not elsewhere classified: Secondary | ICD-10-CM

## 2022-03-22 NOTE — Therapy (Signed)
OUTPATIENT PHYSICAL THERAPY TREATMENT   Patient Name: Julie Reese MRN: [id number] DOB:09-01-1975, 46 y.o., female Today's Date: 03/22/2022   PT End of Session - 03/22/22 0938     Visit Number 7    Number of Visits 17    Date for PT Re-Evaluation 04/12/22    Authorization Type cigna    Authorization - Number of Visits 68    PT Start Time [phone]   late arrival   PT Stop Time 1015    PT Time Calculation (min) 37 min    Activity Tolerance Patient tolerated treatment well    Behavior During Therapy [facility] for tasks assessed/performed               Past Medical History:  Diagnosis Date   Hyperlipidemia 06/05/2016   Obesity (BMI 30.0-34.9) 05/31/2016   Pap smear of cervix shows high risk HPV present 06/28/2016   Last pap 01/2016 with one year follow up.    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   Pap smear of cervix shows high risk HPV present 06/28/2016   Ganglion cyst of right foot 06/27/2016   Post-viral cough syndrome 06/27/2016   Hyperlipidemia 06/05/2016   Obesity (BMI 30.0-34.9) 05/31/2016   RUQ pain 05/31/2016    PCP: Donella Stade, PA-C  REFERRING PROVIDER: Bronson Ing, DPM  REFERRING DIAG: [phone] (ICD-10-CM) - Injury of right ankle, initial encounter M72.2 (ICD-10-CM) - Plantar fascial fibromatosis  THERAPY DIAG:  Pain in right ankle and joints of right foot  Stiffness of right foot, not  elsewhere classified  Other abnormalities of gait and mobility  Muscle weakness (generalized)  Rationale for Evaluation and Treatment: Rehabilitation  ONSET DATE: over summer, fell on foot out of her fiancee's truck   SUBJECTIVE:   SUBJECTIVE STATEMENT: Pt states that the TPDN did well. Pt reports that she had to go up to [place] this past week. Pt states she was able to do her exercises.   Eval: Has increased pain with walking/WB. States symptoms have improved somewhat since onset but continues to bother her. Denies numbness, occasional tingling in posterior foot. Pt states she didn't have pain initially after injury but gradually worsened afterwards. States she initially wore a boot which helped initially but is tough to use at work, same issue with using a brace. Reports having to take increased rest breaks at work and home due to pain.    PERTINENT HISTORY: Rt ankle fx 2009   PAIN:  Are you having pain: 0/10 Location: R heel How would you describe your pain? throbbing Best in past week: 0/10 Worst in past week: 5-6/10 with increased WB Aggravating factors: walking/standing, driving (using pedal), pain with getting socks/shoes on Easing factors: shoe inserts, NWB, rest   PRECAUTIONS: None  WEIGHT BEARING RESTRICTIONS: No  FALLS:  Has patient fallen in last 6 months? 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  PLOF: Independent  PATIENT GOALS: reduce pain, walk better, wants to get back to jogging/running    OBJECTIVE:   PATIENT SURVEYS:  FOTO: 49%, 69% predicted FOTO: 56% (03/16/22)  PALPATION: TTP distal achilles insertion, lateral gastroc/soleus, plantar aspect of foot with some referral into toes with palpation of  quadratus plantae Reduced joint mobility metatarsals 2-3, nonpainful   LOWER EXTREMITY ROM:  Active ROM Right eval Left eval  Hip flexion    Hip extension    Hip abduction    Hip adduction    Hip internal rotation    Hip external rotation    Knee flexion    Knee extension    Ankle dorsiflexion 5 12  Ankle plantarflexion 30 45  Ankle inversion 12 25  Ankle eversion 14 15   (Blank rows = not tested)  LOWER EXTREMITY MMT:  MMT Right eval Left eval  Knee flexion    Knee extension    Ankle dorsiflexion 3+ 5  Ankle plantarflexion modified sitting 3+ 5  Ankle inversion 3+ 5  Ankle eversion 4 5   (Blank rows = not tested) Comments: painful with all MMT except eversion on R foot  FUNCTIONAL TESTS:  STS from standard chair - reduced WB RLE and B UE support   GAIT: Distance walked: within clinic Assistive device utilized: None Level of assistance: Complete Independence Comments: mild antalgic gait on R, no overt instability level surface   TODAY'S TREATMENT:                                                                                                                              [facility] Adult PT Treatment:                                                DATE: 03/22/22 Therapeutic Exercise: Nustep L5 x 5 min Gastroc stretch standing on slant board 2x 30 sec Soleus stretch standing on slant board 2x 30 sec Sitting Ant tib stretch x 30 sec Ankle ev red TB 2x10 Ankle inv red TB 2x10 Ankle DF red TB 2x10 Standing Tandem stance 2x30 sec Manual Therapy: STM, IASTM, and TPR gastroc/soleus and peroneals Grade II to III talocrural joint distraction and for ankle DF    [facility] Adult PT Treatment:                                                DATE: 03/16/22 Therapeutic Exercise: Nustep L5 x 5  min Gastroc stretch standing on slant board 2x 30 sec Soleus stretch standing on slant board 2x 30 sec Sitting Ankle ev yellow TB 2x10 Ankle inv yellow TB 2x10 Ankle DF yellow TB  2x10 Heel raise 2x10 Manual Therapy: STM, IASTM, and TPR gastroc/soleus and peroneals Skilled assessment and palpation for TPDN Trigger Point Dry-Needling  Treatment instructions: Expect mild to moderate muscle soreness. S/S of pneumothorax if dry needled over a lung field, and to seek immediate medical attention should they occur. Patient verbalized understanding of these instructions and education.  Patient Consent Given: Yes Education handout provided: Previously provided Muscles treated: R peroneals Electrical stimulation performed: No Parameters: N/A Treatment response/outcome: Twitch response ilicited, palpable increase in muscle length, decreased pain Modalities: Vasopneumatic around ankle, 34 deg, low pressure x 10 min    PATIENT EDUCATION:  Education details: Pt education on DN and iontophoresis with handouts Person educated: Patient Education method: Explanation, Demonstration, Tactile cues, Verbal cues, and Handouts Education comprehension: verbalized understanding, returned demonstration, verbal cues required, tactile cues required, and needs further education   HOME EXERCISE PROGRAM: Access Code: [email or web address] URL: [email or web address] Date: 03/16/2022 Prepared by: Estill Bamberg April Thurnell Garbe  Exercises - Seated Toe Towel Scrunches  - 1 x daily - 7 x weekly - 3 sets - 10 reps - Seated Arch Lifts  - 2 x daily - 7 x weekly - 1 sets - 3 reps - 30 sec  hold - Seated Ankle Eversion with Resistance (Mirrored)  - 1 x daily - 7 x weekly - 2 sets - 10 reps - CLX Ankle Dorsiflexion and Eversion  - 1 x daily - 7 x weekly - 2 sets - 10 reps - Seated Ankle Inversion with Resistance and Legs Crossed  - 1 x daily - 7 x weekly - 2 sets - 10 reps - Seated Heel Raise  - 1 x daily - 7 x weekly - 2 sets - 10 reps  ASSESSMENT:  CLINICAL IMPRESSION: Decreased tension in peroneals this session. More muscle tightness noted in ant tib this session. Continued gentle manual work  to address joint hypomobility. Progressed pt to red TB with good pt tolerance. Initiated standing stabilization exercises.    OBJECTIVE IMPAIRMENTS: Abnormal gait, decreased activity tolerance, decreased balance, decreased endurance, decreased mobility, difficulty walking, decreased ROM, decreased strength, hypomobility, and pain.   ACTIVITY LIMITATIONS: carrying, lifting, bending, standing, squatting, stairs, transfers, and locomotion level  PARTICIPATION LIMITATIONS: meal prep, cleaning, laundry, driving, shopping, community activity, and occupation  PERSONAL FACTORS: Profession are also affecting patient's functional outcome.   REHAB POTENTIAL: Good  CLINICAL DECISION MAKING: Stable/uncomplicated  EVALUATION COMPLEXITY: Low   GOALS: Goals reviewed with patient? No  SHORT TERM GOALS: Target date: 03/15/2022  Pt will demonstrate appropriate understanding and performance of initially prescribed HEP in order to facilitate improved independence with management of symptoms.  Baseline: has been performing Goal status: MET  2. Pt will score greater than or equal to 59% on FOTO in order to demonstrate improved perception of function due to symptoms.  Baseline: 56% (03/16/22)  Goal status: IN PROGRESS  LONG TERM GOALS: Target date: 04/12/2022   Pt will score 69% or greater on FOTO in order to demonstrate improved perception of function due to symptoms.  Baseline: 49% Goal status: INITIAL  2.  Pt will demonstrate symmetrical ankle DF ROM (within 2 deg) in order to facilitate improved tolerance to functional movements such as stair navigation.  Baseline: see ROM chart above Goal status: INITIAL  3.  Pt  will report/demonstrate ability to stand for up to 1 hr with less than 2 pt increase in pain in order to demonstrate improved tolerance to work/daily activities.  Baseline: up to 7/10 NPS with WB Goal status: INITIAL  4.  Pt will be able to perform STS transfer without UE support,  mechanics grossly WNL, for improved independence/safety w/ transfers Baseline: see above Goal status: INITIAL  5. Pt will demonstrate grossly symmetrical MMT of B feet/ankles in order to facilitate improved strength for WB activities.   Baseline: see MMT chart above  Goal status: INITIAL   PLAN:  PT FREQUENCY: 1-2x/week  PT DURATION: 8 weeks  PLANNED INTERVENTIONS: Therapeutic exercises, Therapeutic activity, Neuromuscular re-education, Balance training, Gait training, Patient/Family education, Self Care, Joint mobilization, Stair training, Orthotic/Fit training, Aquatic Therapy, Dry Needling, Electrical stimulation, Cryotherapy, Moist heat, Taping, Ionotophoresis 77m/ml Dexamethasone, Manual therapy, and Re-evaluation  PLAN FOR NEXT SESSION: Progress ROM/strengthening exercises as indicated, TPDN as needed, progress HEP.   Edword Cu April MGordy Levan PT, DPT 03/22/22 9:41 AM

## 2022-03-23 ENCOUNTER — Encounter: Payer: Self-pay | Admitting: Podiatrist

## 2022-03-23 ENCOUNTER — Ambulatory Visit (INDEPENDENT_AMBULATORY_CARE_PROVIDER_SITE_OTHER): Payer: Managed Care, Other (non HMO) | Admitting: Podiatrist

## 2022-03-23 DIAGNOSIS — S93601D Unspecified sprain of right foot, subsequent encounter: Secondary | ICD-10-CM

## 2022-03-23 NOTE — Progress Notes (Signed)
Chief Complaint  Patient presents with   Ankle Pain    Right ankle pain - patient states pain is much better now. She can still feel it but its no where like it was.     HPI: Patient is 46 y.o. female who presents today for follow up right ankle pain and medial heel pain after sustaining an injury while exiting a tall truck.  She has been in physical therapy and relates this has helped a lot with her pain.  The pain is present but she is able to walk and move without the same pain as she was in her last visit.    Allergies  Allergen Reactions   Doxycycline     RASH on face. She has been out in sun a lot.    Percocet [Oxycodone-Acetaminophen] Other (See Comments)    "makes loopy and unaware of self"    Review of systems is negative except as noted in the HPI.  Denies nausea/ vomiting/ fevers/ chills or night sweats.   Denies difficulty breathing, denies calf pain or tenderness  Physical Exam  Patient is awake, alert, and oriented x 3.  In no acute distress.    Vascular status is intact with palpable pedal pulses DP and PT bilateral and capillary refill time less than 3 seconds bilateral.  No edema or erythema noted.   Neurological exam reveals epicritic and protective sensation grossly intact bilateral.   Dermatological exam reveals skin is supple and dry to bilateral feet.  No open lesions present.    Musculoskeletal exam: Musculature intact with dorsiflexion, plantarflexion, inversion, eversion. Pain at the posterior aspect of the lateral malleolus noted with palpation along the peroneal tendon course is noted.  No discrete pain at the base of the fifth metatarsal identified..  Some discomfort plantar medial heel noted as well.  All areas of discomfort are significantly improved from the last visit.  ASSESSMENT:    ICD-10-CM   1. Sprain of right foot, subsequent encounter  S93.601D        Plan: Discussed exam findings and her progress at today's visit.  I recommend she  continue physical therapy as she is progressing very well with this.  Also discussed she may benefit from an at home TENS units.  She will discuss this with her therapist to see if she agrees.  She does not need to follow-up with [place] unless her pain continues and does not improve with physical therapy..  Discussed that she may benefit from an injection in the future but for now would would hold off.

## 2022-03-28 ENCOUNTER — Encounter: Payer: Self-pay | Admitting: Physical Therapy

## 2022-03-28 ENCOUNTER — Ambulatory Visit: Payer: Managed Care, Other (non HMO) | Admitting: Physical Therapy

## 2022-03-28 DIAGNOSIS — M25674 Stiffness of right foot, not elsewhere classified: Secondary | ICD-10-CM

## 2022-03-28 DIAGNOSIS — M25571 Pain in right ankle and joints of right foot: Secondary | ICD-10-CM

## 2022-03-28 DIAGNOSIS — R2689 Other abnormalities of gait and mobility: Secondary | ICD-10-CM

## 2022-03-28 DIAGNOSIS — M6281 Muscle weakness (generalized): Secondary | ICD-10-CM

## 2022-03-28 NOTE — Therapy (Addendum)
OUTPATIENT PHYSICAL THERAPY TREATMENT   Patient Name: Julie Reese MRN: [id number] DOB:04/12/1975, 46 y.o., female Today's Date: 03/28/2022   PT End of Session - 03/28/22 1320     Visit Number 8    Number of Visits 17    Date for PT Re-Evaluation 04/12/22    Authorization Type cigna    Authorization - Number of Visits 60    PT Start Time 5638    PT Stop Time 1400    PT Time Calculation (min) 43 min    Activity Tolerance Patient tolerated treatment well    Behavior During Therapy [facility] for tasks assessed/performed              Past Medical History:  Diagnosis Date   Hyperlipidemia 06/05/2016   Obesity (BMI 30.0-34.9) 05/31/2016   Pap smear of cervix shows high risk HPV present 06/28/2016   Last pap 01/2016 with one year follow up.    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   Pap smear of cervix shows high risk HPV present 06/28/2016   Ganglion cyst of right foot 06/27/2016   Post-viral cough syndrome 06/27/2016   Hyperlipidemia 06/05/2016   Obesity (BMI 30.0-34.9) 05/31/2016   RUQ pain 05/31/2016    PCP: Donella Stade, PA-C  REFERRING PROVIDER: Bronson Ing, DPM  REFERRING DIAG: [id number] (ICD-10-CM) - Injury of right ankle, initial encounter M72.2 (ICD-10-CM) - Plantar fascial fibromatosis  THERAPY DIAG:  Pain in right ankle and joints of right foot  Stiffness of right foot, not elsewhere  classified  Other abnormalities of gait and mobility  Muscle weakness (generalized)  Rationale for Evaluation and Treatment: Rehabilitation  ONSET DATE: over summer, fell on foot out of her fiancee's truck   SUBJECTIVE:   SUBJECTIVE STATEMENT: Pt states she did a lot of walking while shopping. Her leg did bother her some. Has not been able to do her red band exercises.    PERTINENT HISTORY: Rt ankle fx 2009  Eval: Has increased pain with walking/WB. States symptoms have improved somewhat since onset but continues to bother her. Denies numbness, occasional tingling in posterior foot. Pt states she didn't have pain initially after injury but gradually worsened afterwards. States she initially wore a boot which helped initially but is tough to use at work, same issue with using a brace. Reports having to take increased rest breaks at work and home due to pain.   PAIN:  Are you having pain: 0/10 Location: R heel How would you describe your pain? throbbing Aggravating factors: walking/standing, driving (using pedal) Easing factors: shoe inserts, NWB, rest   PRECAUTIONS: None  WEIGHT BEARING RESTRICTIONS: No  FALLS:  Has patient fallen in last 6 months? 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  PLOF: Independent  PATIENT GOALS: reduce pain, walk better, wants to get back to jogging/running    OBJECTIVE:  (Measures in this section from initial evaluation unless otherwise noted)  PATIENT SURVEYS:  FOTO: 49%, 69% predicted FOTO: 56% (03/16/22)  PALPATION: TTP distal achilles insertion, lateral gastroc/soleus, plantar aspect of foot with some referral into toes with palpation of quadratus plantae Reduced joint mobility metatarsals  2-3, nonpainful   LOWER EXTREMITY ROM:  Active ROM Right eval Left eval  Hip flexion    Hip extension    Hip abduction    Hip adduction    Hip internal rotation    Hip external rotation    Knee flexion    Knee extension    Ankle dorsiflexion 5 12  Ankle plantarflexion 30 45  Ankle inversion 12 25  Ankle eversion 14 15   (Blank rows = not tested)  LOWER EXTREMITY MMT:  MMT Right eval Left eval  Knee flexion    Knee extension    Ankle dorsiflexion 3+ 5  Ankle plantarflexion modified sitting 3+ 5  Ankle inversion 3+ 5  Ankle eversion 4 5   (Blank rows = not tested) Comments: painful with all MMT except eversion on R foot  FUNCTIONAL TESTS:  STS from standard chair - reduced WB RLE and B UE support   GAIT: Distance walked: within clinic Assistive device utilized: None Level of assistance: Complete Independence Comments: mild antalgic gait on R with R lateral lean during stance phase   [facility] Adult PT Treatment:                                                DATE: 03/28/22 Therapeutic Exercise: Nustep L6 x 5 min UEs/LEs Gastroc stretch standing on slant board 2x30 sec Soleus stretch standing on slant board 2x30 sec Sitting Ant tib x30 sec Ankle ev red TB 2x10 Ankle inv red TB 2x10 Ankle DF red TB 2x10 Standing Heel raise 2x10 Tandem stance 2x30 sec Hip abd red TB 2x10                                                                                                                               [facility] Adult PT Treatment:                                                DATE: 03/22/22 Therapeutic Exercise: Nustep L5 x 5 min Gastroc stretch standing on slant board 2x 30 sec Soleus stretch standing on slant board 2x 30 sec Sitting Ant tib stretch x 30 sec Ankle ev red TB 2x10 Ankle inv  red TB 2x10 Ankle DF red TB 2x10 Standing Tandem stance 2x30 sec Manual Therapy: STM, IASTM, and TPR gastroc/soleus and peroneals Grade II to III talocrural joint distraction  and for ankle DF    [facility] Adult PT Treatment:                                                DATE: 03/16/22 Trigger Point Dry-Needling  Treatment instructions: Expect mild to moderate muscle soreness. S/S of pneumothorax if dry needled over a lung field, and to seek immediate medical attention should they occur. Patient verbalized understanding of these instructions and education.  Patient Consent Given: Yes Education handout provided: Previously provided Muscles treated: R peroneals Electrical stimulation performed: No Parameters: N/A Treatment response/outcome: Twitch response ilicited, palpable increase in muscle length, decreased pain Modalities: Vasopneumatic around ankle, 34 deg, low pressure x 10 min    PATIENT EDUCATION:  Education details: POC and progressions Person educated: Patient Education method: Explanation, Demonstration, Tactile cues, Verbal cues, and Handouts Education comprehension: verbalized understanding, returned demonstration, verbal cues required, tactile cues required, and needs further education   HOME EXERCISE PROGRAM: Access Code: [email or web address] URL: [email or web address] Date: 03/16/2022 Prepared by: Estill Bamberg April Thurnell Garbe  Exercises - Seated Toe Towel Scrunches  - 1 x daily - 7 x weekly - 3 sets - 10 reps - Seated Arch Lifts  - 2 x daily - 7 x weekly - 1 sets - 3 reps - 30 sec  hold - Seated Ankle Eversion with Resistance (Mirrored)  - 1 x daily - 7 x weekly - 2 sets - 10 reps - CLX Ankle Dorsiflexion and Eversion  - 1 x daily - 7 x weekly - 2 sets - 10 reps - Seated Ankle Inversion with Resistance and Legs Crossed  - 1 x daily - 7 x weekly - 2 sets - 10 reps - Seated Heel Raise  - 1 x daily - 7 x weekly - 2 sets - 10 reps  ASSESSMENT:  CLINICAL IMPRESSION: Continued to work on progressing foot/ankle strengthening. Still only able to tolerate red TB. Worked on progressing into standing and full weightbearing. Pt still demonstrates R  truncal lean with gait likely due to hip abductor/gross R LE weakness compared to L LE.    OBJECTIVE IMPAIRMENTS: Abnormal gait, decreased activity tolerance, decreased balance, decreased endurance, decreased mobility, difficulty walking, decreased ROM, decreased strength, hypomobility, and pain.    GOALS: Goals reviewed with patient? No  SHORT TERM GOALS: Target date: 03/15/2022  Pt will demonstrate appropriate understanding and performance of initially prescribed HEP in order to facilitate improved independence with management of symptoms.  Baseline: has been performing Goal status: MET  2. Pt will score greater than or equal to 59% on FOTO in order to demonstrate improved perception of function due to symptoms.  Baseline: 56% (03/16/22)  Goal status: IN PROGRESS  LONG TERM GOALS: Target date: 04/12/2022   Pt will score 69% or greater on FOTO in order to demonstrate improved perception of function due to symptoms.  Baseline: 49% Goal status: INITIAL  2.  Pt will demonstrate symmetrical ankle DF ROM (within 2 deg) in order to facilitate improved tolerance to functional movements such as stair navigation.  Baseline: see ROM chart above Goal status: INITIAL  3.  Pt will report/demonstrate ability to stand for up to 1 hr with less than  2 pt increase in pain in order to demonstrate improved tolerance to work/daily activities.  Baseline: up to 7/10 NPS with WB Goal status: INITIAL  4.  Pt will be able to perform STS transfer without UE support, mechanics grossly WNL, for improved independence/safety w/ transfers Baseline: see above Goal status: INITIAL  5. Pt will demonstrate grossly symmetrical MMT of B feet/ankles in order to facilitate improved strength for WB activities.   Baseline: see MMT chart above  Goal status: INITIAL   PLAN:  PT FREQUENCY: 1-2x/week  PT DURATION: 8 weeks  PLANNED INTERVENTIONS: Therapeutic exercises, Therapeutic activity, Neuromuscular re-education,  Balance training, Gait training, Patient/Family education, Self Care, Joint mobilization, Stair training, Orthotic/Fit training, Aquatic Therapy, Dry Needling, Electrical stimulation, Cryotherapy, Moist heat, Taping, Ionotophoresis 73m/ml Dexamethasone, Manual therapy, and Re-evaluation  PLAN FOR NEXT SESSION: Progress to green TB if tolerated. Work on hip strengthening/LE strengthening to improve full weightbearing on R LE. Consider leg press and stairs. TPDN as needed, progress HEP.   Shree Espey April Ma L Jac Romulus, PT, DPT 03/28/22 1:20 PM

## 2022-03-29 ENCOUNTER — Encounter: Payer: Managed Care, Other (non HMO) | Admitting: Physical Therapy

## 2022-04-04 ENCOUNTER — Ambulatory Visit: Payer: Managed Care, Other (non HMO) | Admitting: Physical Therapy

## 2022-04-04 ENCOUNTER — Encounter: Payer: Self-pay | Admitting: Physical Therapy

## 2022-04-04 DIAGNOSIS — M6281 Muscle weakness (generalized): Secondary | ICD-10-CM

## 2022-04-04 DIAGNOSIS — M25571 Pain in right ankle and joints of right foot: Secondary | ICD-10-CM | POA: Diagnosis not present

## 2022-04-04 DIAGNOSIS — R2689 Other abnormalities of gait and mobility: Secondary | ICD-10-CM

## 2022-04-04 DIAGNOSIS — M25674 Stiffness of right foot, not elsewhere classified: Secondary | ICD-10-CM

## 2022-04-04 NOTE — Therapy (Signed)
OUTPATIENT PHYSICAL THERAPY TREATMENT   Patient Name: FRANCY MCILVAINE MRN: [id number] DOB:1975/04/24, 46 y.o., female Today's Date: 04/04/2022   PT End of Session - 04/04/22 1604     Visit Number 9    Number of Visits 17    Date for PT Re-Evaluation 04/12/22    PT Start Time 1526    PT Stop Time 1606    PT Time Calculation (min) 40 min    Activity Tolerance Patient tolerated treatment well    Behavior During Therapy [facility] for tasks assessed/performed               Past Medical History:  Diagnosis Date   Hyperlipidemia 06/05/2016   Obesity (BMI 30.0-34.9) 05/31/2016   Pap smear of cervix shows high risk HPV present 06/28/2016   Last pap 01/2016 with one year follow up.    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   Pap smear of cervix shows high risk HPV present 06/28/2016   Ganglion cyst of right foot 06/27/2016   Post-viral cough syndrome 06/27/2016   Hyperlipidemia 06/05/2016   Obesity (BMI 30.0-34.9) 05/31/2016   RUQ pain 05/31/2016    PCP: Donella Stade, PA-C  REFERRING PROVIDER: Bronson Ing, DPM  REFERRING DIAG: [phone] (ICD-10-CM) - Injury of right ankle, initial encounter M72.2 (ICD-10-CM) - Plantar fascial fibromatosis  THERAPY DIAG:  Pain in right ankle and joints of right foot  Stiffness of right foot, not elsewhere classified  Other abnormalities of gait and mobility  Muscle weakness  (generalized)  Rationale for Evaluation and Treatment: Rehabilitation  ONSET DATE: over summer, fell on foot out of her fiancee's truck   SUBJECTIVE:   SUBJECTIVE STATEMENT: Pt states she has no increase in pain. She has been off of work for the holiday so it has been nice to be off of her feet   PERTINENT HISTORY: Rt ankle fx 2009  Eval: Has increased pain with walking/WB. States symptoms have improved somewhat since onset but continues to bother her. Denies numbness, occasional tingling in posterior foot. Pt states she didn't have pain initially after injury but gradually worsened afterwards. States she initially wore a boot which helped initially but is tough to use at work, same issue with using a brace. Reports having to take increased rest breaks at work and home due to pain.   PAIN:  Are you having pain: 0/10 Location: R heel How would you describe your pain? throbbing Aggravating factors: walking/standing, driving (using pedal) Easing factors: shoe inserts, NWB, rest   PRECAUTIONS: None  WEIGHT BEARING RESTRICTIONS: No  FALLS:  Has patient fallen in last 6 months? 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  PLOF: Independent  PATIENT GOALS: reduce pain, walk better, wants to get back to jogging/running    OBJECTIVE:  (Measures in this section from initial evaluation unless otherwise noted)  PATIENT SURVEYS:  FOTO: 49%, 69% predicted FOTO: 56% (03/16/22)  PALPATION: TTP distal achilles insertion, lateral gastroc/soleus, plantar aspect of foot with some referral into toes with palpation of quadratus plantae Reduced joint mobility metatarsals 2-3, nonpainful   LOWER EXTREMITY ROM:  Active ROM Right eval Left eval   Hip flexion    Hip extension    Hip abduction    Hip adduction    Hip internal rotation    Hip external rotation    Knee flexion    Knee extension    Ankle dorsiflexion 5 12  Ankle plantarflexion 30 45  Ankle inversion 12 25  Ankle eversion 14 15   (Blank rows = not tested)  LOWER EXTREMITY MMT:  MMT Right eval Left eval  Knee flexion    Knee extension    Ankle dorsiflexion 3+ 5  Ankle plantarflexion modified sitting 3+ 5  Ankle inversion 3+ 5  Ankle eversion 4 5   (Blank rows = not tested) Comments: painful with all MMT except eversion on R foot  FUNCTIONAL TESTS:  STS from standard chair - reduced WB RLE and B UE support   GAIT: Distance walked: within clinic Assistive device utilized: None Level of assistance: Complete Independence Comments: mild antalgic gait on R with R lateral lean during stance phase   [facility] Adult PT Treatment:                                                DATE: 04/04/22 Therapeutic Exercise: Recumbent L1 x 5 min for warm up Gastroc stretch 2 x 30 sec slant board Soleus stretch 2 x 30 sec slant board Heel raise 2 x 10 Side step red TB around feet 10 steps x 4 bilat Rockeboard x 1 min each A/P and laterally Ankle 4 way red TB x 20 each BAPS 2 x 10 L1   [facility] Adult PT Treatment:                                                DATE: 03/28/22 Therapeutic Exercise: Nustep L6 x 5 min UEs/LEs Gastroc stretch standing on slant board 2x30 sec Soleus stretch standing on slant board 2x30 sec Sitting Ant tib x30 sec Ankle ev red TB 2x10 Ankle inv red TB 2x10 Ankle DF red TB 2x10 Standing Heel raise 2x10 Tandem stance 2x30 sec Hip abd red TB 2x10  PATIENT EDUCATION:  Education details: POC and progressions Person educated: Patient Education method: Explanation, Demonstration, Tactile cues, Verbal cues, and  Handouts Education comprehension: verbalized understanding, returned demonstration, verbal cues required, tactile cues required, and needs further education   HOME EXERCISE PROGRAM: Access Code: [email or web address] URL: [email or web address] Date: 03/16/2022 Prepared by: Estill Bamberg April Thurnell Garbe  Exercises - Seated Toe Towel Scrunches  - 1 x daily - 7 x weekly - 3 sets - 10 reps - Seated Arch Lifts  - 2 x daily - 7 x weekly - 1 sets - 3 reps - 30 sec  hold - Seated Ankle Eversion with Resistance (Mirrored)  - 1 x daily - 7 x weekly - 2 sets - 10 reps - CLX Ankle Dorsiflexion and Eversion  - 1 x daily - 7 x weekly - 2 sets - 10 reps - Seated Ankle Inversion with Resistance and Legs Crossed  - 1 x daily - 7 x weekly - 2 sets - 10 reps - Seated Heel Raise  - 1 x daily - 7 x weekly - 2 sets - 10 reps  ASSESSMENT:  CLINICAL IMPRESSION: Pt continues to be limited by pain especially with standing activities. PT emphasized importance of performing HEP for strengthening at home to improve muscular endurance   OBJECTIVE IMPAIRMENTS: Abnormal gait, decreased activity tolerance, decreased balance, decreased endurance, decreased mobility, difficulty walking, decreased ROM, decreased strength, hypomobility, and pain.    GOALS: Goals reviewed with patient? No  SHORT TERM GOALS: Target date: 03/15/2022  Pt will demonstrate appropriate understanding and performance of initially prescribed HEP in order to facilitate improved independence with management of symptoms.  Baseline: has been performing Goal status: MET  2. Pt will score greater than or equal to 59% on FOTO in order to demonstrate improved perception of function due to symptoms.  Baseline: 56% (03/16/22)  Goal status: IN PROGRESS  LONG TERM GOALS: Target date: 04/12/2022   Pt will score 69% or greater on FOTO in order to demonstrate improved perception of function due to symptoms.  Baseline: 49% Goal status: INITIAL  2.  Pt will  demonstrate symmetrical ankle DF ROM (within 2 deg) in order to facilitate improved tolerance to functional movements such as stair navigation.  Baseline: see ROM chart above Goal status: INITIAL  3.  Pt will report/demonstrate ability to stand for up to 1 hr with less than 2 pt increase in pain in order to demonstrate improved tolerance to work/daily activities.  Baseline: up to 7/10 NPS with WB Goal status: INITIAL  4.  Pt will be able to perform STS transfer without UE support, mechanics grossly WNL, for improved independence/safety w/ transfers Baseline: see above Goal status: MET  5. Pt will demonstrate grossly symmetrical MMT of B feet/ankles in order to facilitate improved strength for WB activities.   Baseline: see MMT chart above  Goal status: INITIAL   PLAN:  PT FREQUENCY: 1-2x/week  PT DURATION: 8 weeks  PLANNED INTERVENTIONS: Therapeutic exercises, Therapeutic activity, Neuromuscular re-education, Balance training, Gait training, Patient/Family education, Self Care, Joint mobilization, Stair training, Orthotic/Fit training, Aquatic Therapy, Dry Needling, Electrical stimulation, Cryotherapy, Moist heat, Taping, Ionotophoresis 85m/ml Dexamethasone, Manual therapy, and Re-evaluation  PLAN FOR NEXT SESSION: CHECK GOALS, RECERT Progress to green TB if tolerated. Work on hip strengthening/LE strengthening to improve full weightbearing on R LE. Consider leg press and stairs. TPDN as needed, progress HEP.   DONAWERTH,KAREN, PT 04/04/22 4:05 PM

## 2022-04-11 ENCOUNTER — Ambulatory Visit: Payer: Managed Care, Other (non HMO) | Attending: Podiatrist | Admitting: Physical Therapy

## 2022-04-11 ENCOUNTER — Encounter: Payer: Self-pay | Admitting: Physical Therapy

## 2022-04-11 DIAGNOSIS — M25674 Stiffness of right foot, not elsewhere classified: Secondary | ICD-10-CM | POA: Insufficient documentation

## 2022-04-11 DIAGNOSIS — M25571 Pain in right ankle and joints of right foot: Secondary | ICD-10-CM

## 2022-04-11 DIAGNOSIS — R2689 Other abnormalities of gait and mobility: Secondary | ICD-10-CM | POA: Insufficient documentation

## 2022-04-11 DIAGNOSIS — M6281 Muscle weakness (generalized): Secondary | ICD-10-CM

## 2022-04-11 NOTE — Therapy (Signed)
OUTPATIENT PHYSICAL THERAPY TREATMENT AND RECERTIFICATION   Patient Name: Julie Reese MRN: [id number] DOB:01/01/1976, 47 y.o., female Today's Date: 04/11/2022   PT End of Session - 04/11/22 1352     Visit Number 10    Number of Visits 16    Date for PT Re-Evaluation 05/23/22    Authorization - Visit Number 10    Authorization - Number of Visits 60    PT Start Time 8250    PT Stop Time 5397    PT Time Calculation (min) 39 min    Activity Tolerance Patient tolerated treatment well    Behavior During Therapy [facility] for tasks assessed/performed                Past Medical History:  Diagnosis Date   Hyperlipidemia 06/05/2016   Obesity (BMI 30.0-34.9) 05/31/2016   Pap smear of cervix shows high risk HPV present 06/28/2016   Last pap 01/2016 with one year follow up.    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   Pap smear of cervix shows high risk HPV present 06/28/2016   Ganglion cyst of right foot 06/27/2016   Post-viral cough syndrome 06/27/2016   Hyperlipidemia 06/05/2016   Obesity (BMI 30.0-34.9) 05/31/2016   RUQ pain 05/31/2016    PCP: Donella Stade, PA-C  REFERRING PROVIDER: Bronson Ing, DPM  REFERRING DIAG: [phone] (ICD-10-CM) - Injury of right ankle, initial encounter M72.2 (ICD-10-CM) - Plantar fascial fibromatosis  THERAPY DIAG:  Pain in right ankle and joints of right foot  Other abnormalities of  gait and mobility  Stiffness of right foot, not elsewhere classified  Muscle weakness (generalized)  Rationale for Evaluation and Treatment: Rehabilitation  ONSET DATE: over summer, fell on foot out of her fiancee's truck   SUBJECTIVE:   SUBJECTIVE STATEMENT: Pt states she feels much better. She states she is better able to stand and walk with decreased pain   PERTINENT HISTORY: Rt ankle fx 2009  Eval: Has increased pain with walking/WB. States symptoms have improved somewhat since onset but continues to bother her. Denies numbness, occasional tingling in posterior foot. Pt states she didn't have pain initially after injury but gradually worsened afterwards. States she initially wore a boot which helped initially but is tough to use at work, same issue with using a brace. Reports having to take increased rest breaks at work and home due to pain.   PAIN:  Are you having pain: 0/10 Location: R heel How would you describe your pain? throbbing Aggravating factors: walking/standing, driving (using pedal) Easing factors: shoe inserts, NWB, rest   PRECAUTIONS: None  WEIGHT BEARING RESTRICTIONS: No  FALLS:  Has patient fallen in last 6 months? 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  PLOF: Independent  PATIENT GOALS: reduce pain, walk better, wants to get back to jogging/running    OBJECTIVE:  (Measures in this section from initial evaluation unless otherwise noted)  PATIENT SURVEYS:  FOTO: 49%, 69% predicted FOTO: 56% (03/16/22) FOTO: 60% (04/11/22)  PALPATION: TTP distal achilles insertion, lateral gastroc/soleus, plantar aspect of foot with some referral into toes with palpation of quadratus plantae Reduced joint mobility  metatarsals 2-3, nonpainful   LOWER EXTREMITY ROM:  Active ROM Right eval Left eval Right 04/11/22  Hip flexion     Hip extension     Hip abduction     Hip adduction     Hip internal rotation     Hip external rotation     Knee flexion     Knee extension     Ankle dorsiflexion _0 Ankle plantarflexion 30 45 45  Ankle inversion _1 Ankle eversion _2 (Blank rows = not tested)  LOWER EXTREMITY MMT:  MMT Right eval Left eval Right 04/11/22  Knee flexion     Knee extension     Ankle dorsiflexion 3+ 5 4  Ankle plantarflexion modified sitting 3+ 5 4  Ankle inversion 3+ 5 4-  Ankle eversion 4 5 4- pain   (Blank rows = not tested) Comments: painful with all MMT except eversion on R foot on EVAL  FUNCTIONAL TESTS:  STS from standard chair - reduced WB RLE and B UE support   GAIT: Distance walked: within clinic Assistive device utilized: None Level of assistance: Complete Independence Comments: mild antalgic gait on R with R lateral lean during stance phase   [facility] Adult PT Treatment:                                                DATE: 04/11/22 Therapeutic Exercise: Nustep L6 x 5 min for warm up Ankle 4 way green TB x 20 Rockerboard 1 min each A/P and laterally Heel raises on step with increased DF stretch at end range 2 x 10 Sidestep red TB around feet 20 steps x 4 Seated PF stretch 2 x 30 sec    [facility] Adult PT Treatment:                                                DATE: 04/04/22 Therapeutic Exercise: Recumbent L1 x 5 min for warm up Gastroc stretch 2 x 30 sec slant board Soleus stretch 2 x 30 sec slant board Heel raise 2 x 10 Side step red TB around feet 10 steps x 4 bilat Rockeboard x 1 min each A/P and laterally Ankle 4 way red TB x 20 each BAPS 2 x 10 L1   [facility] Adult PT Treatment:                                                DATE: 03/28/22 Therapeutic Exercise: Nustep L6 x 5 min UEs/LEs Gastroc stretch standing on slant board 2x30  sec Soleus stretch standing on  slant board 2x30 sec Sitting Ant tib x30 sec Ankle ev red TB 2x10 Ankle inv red TB 2x10 Ankle DF red TB 2x10 Standing Heel raise 2x10 Tandem stance 2x30 sec Hip abd red TB 2x10                                                                                                                                PATIENT EDUCATION:  Education details: POC and progressions Person educated: Patient Education method: Explanation, Demonstration, Tactile cues, Verbal cues, and Handouts Education comprehension: verbalized understanding, returned demonstration, verbal cues required, tactile cues required, and needs further education   HOME EXERCISE PROGRAM: Access Code: [email or web address] URL: [email or web address] Date: 04/11/2022 Prepared by: Isabelle Course  Exercises - Seated Toe Towel Scrunches  - 1 x daily - 7 x weekly - 3 sets - 10 reps - Seated Arch Lifts  - 2 x daily - 7 x weekly - 1 sets - 3 reps - 30 sec  hold - Seated Ankle Eversion with Resistance (Mirrored)  - 1 x daily - 7 x weekly - 2 sets - 10 reps - CLX Ankle Dorsiflexion and Eversion  - 1 x daily - 7 x weekly - 2 sets - 10 reps - Seated Ankle Inversion with Resistance and Legs Crossed  - 1 x daily - 7 x weekly - 2 sets - 10 reps - Tandem Stance with Support  - 1 x daily - 7 x weekly - 2 sets - 30 sec hold - Standing Heel Raise with Support  - 1 x daily - 7 x weekly - 2 sets - 10 reps - Seated Ankle Plantarflexion Dorsiflexion PROM  - 1 x daily - 7 x weekly - 1 sets - 3 reps - 30 seconds hold - Side Stepping with Resistance at Feet  - 1 x daily - 7 x weekly - 3 sets - 10 reps  ASSESSMENT:  CLINICAL IMPRESSION: Pt is making progress towards long term goals. Still has pain with resisted eversion and with increased plantar flexion ROM. HEP updated. She will continue to benefit from skilled PT to work towards remaining goals.   OBJECTIVE IMPAIRMENTS: Abnormal gait, decreased activity  tolerance, decreased balance, decreased endurance, decreased mobility, difficulty walking, decreased ROM, decreased strength, hypomobility, and pain.    GOALS: Goals reviewed with patient? No  SHORT TERM GOALS: Target date: 03/15/2022  Pt will demonstrate appropriate understanding and performance of initially prescribed HEP in order to facilitate improved independence with management of symptoms.  Baseline: has been performing Goal status: MET  2. Pt will score greater than or equal to 59% on FOTO in order to demonstrate improved perception of function due to symptoms.  Baseline: 56% (03/16/22)  Goal status: IN PROGRESS  LONG TERM GOALS: Target date: 05/23/2022   Pt will score 69% or greater on FOTO in order to demonstrate improved perception of function due to symptoms.  Baseline: 49% Goal status: NOT MET  2.  Pt will demonstrate symmetrical ankle DF ROM (within 2 deg) in order to facilitate improved tolerance to functional movements such as stair navigation.  Baseline: see ROM chart above Goal status: MET  3.  Pt will report/demonstrate ability to stand for up to 1 hr with less than 2 pt increase in pain in order to demonstrate improved tolerance to work/daily activities.  Baseline: up to 7/10 NPS with WB Goal status: MET  4.  Pt will be able to perform STS transfer without UE support, mechanics grossly WNL, for improved independence/safety w/ transfers Baseline: see above Goal status: MET  5. Pt will demonstrate grossly symmetrical MMT of B feet/ankles in order to facilitate improved strength for WB activities.   Baseline: see MMT chart above  Goal status: NOT MET   PLAN:  PT FREQUENCY: 1x/week  PT DURATION: 6 weeks  PLANNED INTERVENTIONS: Therapeutic exercises, Therapeutic activity, Neuromuscular re-education, Balance training, Gait training, Patient/Family education, Self Care, Joint mobilization, Stair training, Orthotic/Fit training, Aquatic Therapy, Dry Needling,  Electrical stimulation, Cryotherapy, Moist heat, Taping, Ionotophoresis 106m/ml Dexamethasone, Manual therapy, and Re-evaluation  PLAN FOR NEXT SESSION: d/c or continue? Ankle strength and endurance  Decklin Weddington, PT 04/11/22 1:54 PM

## 2022-04-18 ENCOUNTER — Ambulatory Visit: Payer: Managed Care, Other (non HMO) | Admitting: Physical Therapy

## 2022-04-20 ENCOUNTER — Encounter: Payer: Self-pay | Admitting: Physical Therapy

## 2022-04-20 ENCOUNTER — Ambulatory Visit: Payer: Managed Care, Other (non HMO) | Admitting: Physical Therapy

## 2022-04-20 DIAGNOSIS — M25674 Stiffness of right foot, not elsewhere classified: Secondary | ICD-10-CM

## 2022-04-20 DIAGNOSIS — M6281 Muscle weakness (generalized): Secondary | ICD-10-CM

## 2022-04-20 DIAGNOSIS — M25571 Pain in right ankle and joints of right foot: Secondary | ICD-10-CM | POA: Diagnosis not present

## 2022-04-20 DIAGNOSIS — R2689 Other abnormalities of gait and mobility: Secondary | ICD-10-CM

## 2022-04-20 NOTE — Therapy (Addendum)
OUTPATIENT PHYSICAL THERAPY TREATMENT AND DISCHARGE  PHYSICAL THERAPY DISCHARGE SUMMARY  Visits from Start of Care: 11  Current functional level related to goals / functional outcomes: See below   Remaining deficits: See below   Education / Equipment: See below   Patient agrees to discharge. Patient goals were partially met. Patient is being discharged due to not returning since the last visit.   Patient Name: DAVIANNA MULCH MRN: [id number] DOB:[birth year], 47 y.o., female Today's Date: 04/20/2022   PT End of Session - 04/20/22 1623     Visit Number 11    Number of Visits 16    Date for PT Re-Evaluation 05/23/22    Authorization - Number of Visits 60    PT Start Time 1623    PT Stop Time 1701    PT Time Calculation (min) 38 min    Activity Tolerance Patient tolerated treatment well    Behavior During Therapy [facility] for tasks assessed/performed             Past Medical History:  Diagnosis Date   Hyperlipidemia 06/05/2016   Obesity (BMI 30.0-34.9) 05/31/2016   Pap smear of cervix shows high risk HPV present 06/28/2016   Last pap 01/2016 with one year follow up.    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   Pap smear of cervix shows high risk HPV present 06/28/2016   Ganglion cyst of right foot 06/27/2016   Post-viral cough syndrome 06/27/2016   Hyperlipidemia 06/05/2016   Obesity (BMI 30.0-34.9) 05/31/2016   RUQ pain  05/31/2016    PCP: Donella Stade, PA-C  REFERRING PROVIDER: Bronson Ing, DPM  REFERRING DIAG: [phone] (ICD-10-CM) - Injury of right ankle, initial encounter M72.2 (ICD-10-CM) - Plantar fascial fibromatosis  THERAPY DIAG:  Pain in right ankle and joints of right foot  Other abnormalities of gait and mobility  Stiffness of right foot, not elsewhere classified  Muscle weakness (generalized)  Rationale for Evaluation and Treatment: Rehabilitation  ONSET DATE: over summer, fell on foot out of her fiancee's truck   SUBJECTIVE:   SUBJECTIVE STATEMENT: Foot has been alright. Pt does note that weather did aggravate her foot.    PERTINENT HISTORY: Rt ankle fx 2009  Eval: Has increased pain with walking/WB. States symptoms have improved somewhat since onset but continues to bother her. Denies numbness, occasional tingling in posterior foot. Pt states she didn't have pain initially after injury but gradually worsened afterwards. States she initially wore a boot which helped initially but is tough to use at work, same issue with using a brace. Reports having to take increased rest breaks at work and home due to pain.   PAIN:  Are you having pain: 0/10 Location: R heel How would you describe your pain? throbbing Aggravating factors: walking/standing, driving (using pedal) Easing factors: shoe inserts, NWB, rest   PRECAUTIONS: None  WEIGHT BEARING RESTRICTIONS: No  FALLS:  Has  patient fallen in last 6 months? 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  PLOF: Independent  PATIENT GOALS: reduce pain, walk better, wants to get back to jogging/running    OBJECTIVE:  (Measures in this section from initial evaluation unless  otherwise noted)  PATIENT SURVEYS:  FOTO: 49%, 69% predicted FOTO: 56% (03/16/22) FOTO: 60% (04/11/22)  PALPATION: TTP distal achilles insertion, lateral gastroc/soleus, plantar aspect of foot with some referral into toes with palpation of quadratus plantae Reduced joint mobility metatarsals 2-3, nonpainful   LOWER EXTREMITY ROM:  Active ROM Right eval Left eval Right 04/11/22  Hip flexion     Hip extension     Hip abduction     Hip adduction     Hip internal rotation     Hip external rotation     Knee flexion     Knee extension     Ankle dorsiflexion '5 12 10  '$ Ankle plantarflexion 30 45 45  Ankle inversion '12 25 20  '$ Ankle eversion '14 15 14   '$ (Blank rows = not tested)  LOWER EXTREMITY MMT:  MMT Right eval Left eval Right 04/11/22  Knee flexion     Knee extension     Ankle dorsiflexion 3+ 5 4  Ankle plantarflexion modified sitting 3+ 5 4  Ankle inversion 3+ 5 4-  Ankle eversion 4 5 4- pain   (Blank rows = not tested) Comments: painful with all MMT except eversion on R foot on EVAL  FUNCTIONAL TESTS:  STS from standard chair - reduced WB RLE and B UE support   GAIT: Distance walked: within clinic Assistive device utilized: None Level of assistance: Complete Independence Comments: mild antalgic gait on R with R lateral lean during stance phase  [facility] Adult PT Treatment:                                                DATE: 04/20/22 Therapeutic Exercise: Recumbent bike L1 x 5 min Gastroc stretch on 4" step 2x30 sec Soleus stretch on 4" step 2x30 sec Heel raises on step with increased DF stretch at end range 2 x 10 BAPS L1 standing inv/ev 2x10, attempted in [place] but pt performed in bare feet and BAPS started to hurt bottom of pt's foot On blue side of bosu ankle inv/ev 2x10 Neuromuscular re-ed: On airex partial tandem stance 2x30 sec Heel walking 2x15' Toe walking 2x15'   [facility] Adult PT Treatment:                                                DATE:  04/11/22 Therapeutic Exercise: Nustep L6 x 5 min for warm up Ankle 4 way green TB x 20 Rockerboard 1 min each A/P and laterally Heel raises on step with increased DF stretch at end range 2 x 10 Sidestep red TB around feet 20 steps x 4 Seated PF stretch 2 x 30 sec  PATIENT EDUCATION:  Education details: POC and progressions Person educated: Patient Education method: Explanation, Demonstration, Tactile cues, Verbal cues, and Handouts Education comprehension: verbalized understanding, returned demonstration, verbal cues required, tactile cues required, and needs further education   HOME EXERCISE PROGRAM: Access Code: [email or web address] URL: [email or web address] Date: 04/20/2022 Prepared by: Estill Bamberg April Thurnell Garbe  Exercises - Seated Toe Towel Scrunches  - 1 x daily - 7 x weekly - 3 sets - 10 reps - Seated Arch Lifts  - 2 x daily - 7 x weekly - 1 sets - 3 reps - 30 sec  hold - Seated Ankle Eversion with Resistance (Mirrored)  - 1 x daily - 7 x weekly - 2 sets - 10 reps - CLX Ankle Dorsiflexion and Eversion  - 1 x daily - 7 x weekly - 2 sets - 10 reps - Seated Ankle Inversion with Resistance and Legs Crossed  - 1 x daily - 7 x weekly - 2 sets - 10 reps - Tandem Stance with Support  - 1 x daily - 7 x weekly - 2 sets - 30 sec hold - Standing Heel Raise with Support  - 1 x daily - 7 x weekly - 2 sets - 10 reps - Seated Ankle Plantarflexion Dorsiflexion PROM  - 1 x daily - 7 x weekly - 1 sets - 3 reps - 30 seconds hold - Side Stepping with Resistance at Feet  - 1 x daily - 7 x weekly - 3 sets - 10 reps - Toe Walking  - 1 x daily - 7 x weekly - 3 sets - 10 reps - Heel Walking  - 1 x daily - 7 x weekly - 3 sets - 10 reps  ASSESSMENT:  CLINICAL IMPRESSION: Treatment worked on progressing standing tolerance and stability. Working on improving muscle endurance and  dynamic balance with heel/toe walking. Pt is close to meeting her LTGs. Discussed decreasing PT frequency to allow more time to work on strengthening.    OBJECTIVE IMPAIRMENTS: Abnormal gait, decreased activity tolerance, decreased balance, decreased endurance, decreased mobility, difficulty walking, decreased ROM, decreased strength, hypomobility, and pain.    GOALS: Goals reviewed with patient? No  SHORT TERM GOALS: Target date: 03/15/2022  Pt will demonstrate appropriate understanding and performance of initially prescribed HEP in order to facilitate improved independence with management of symptoms.  Baseline: has been performing Goal status: MET  2. Pt will score greater than or equal to 59% on FOTO in order to demonstrate improved perception of function due to symptoms.  Baseline: 56% (03/16/22)  Goal status: IN PROGRESS  LONG TERM GOALS: Target date: 05/23/2022   Pt will score 69% or greater on FOTO in order to demonstrate improved perception of function due to symptoms.  Baseline: 49% Goal status: NOT MET  2.  Pt will demonstrate symmetrical ankle DF ROM (within 2 deg) in order to facilitate improved tolerance to functional movements such as stair navigation.  Baseline: see ROM chart above Goal status: MET  3.  Pt will report/demonstrate ability to stand for up to 1 hr with less than 2 pt increase in pain in order to demonstrate improved tolerance to work/daily activities.  Baseline: up to 7/10 NPS with WB Goal status: MET  4.  Pt will be able to perform STS transfer without UE support, mechanics grossly WNL, for improved independence/safety w/ transfers Baseline: see above Goal status: MET  5. Pt will demonstrate grossly symmetrical MMT of B feet/ankles in order to facilitate improved strength for WB  activities.   Baseline: see MMT chart above  Goal status: NOT MET   PLAN:  PT FREQUENCY: 1x/week  PT DURATION: 6 weeks  PLANNED INTERVENTIONS: Therapeutic exercises,  Therapeutic activity, Neuromuscular re-education, Balance training, Gait training, Patient/Family education, Self Care, Joint mobilization, Stair training, Orthotic/Fit training, Aquatic Therapy, Dry Needling, Electrical stimulation, Cryotherapy, Moist heat, Taping, Ionotophoresis '4mg'$ /ml Dexamethasone, Manual therapy, and Re-evaluation  PLAN FOR NEXT SESSION: Ankle strength and endurance  Anapaola Kinsel April Ma L Kathrin Folden, PT 04/20/22 4:24 PM

## 2022-05-21 ENCOUNTER — Encounter: Payer: Self-pay | Admitting: Physician Assistant

## 2022-08-09 ENCOUNTER — Ambulatory Visit: Payer: Managed Care, Other (non HMO) | Admitting: Physician Assistant

## 2022-08-11 ENCOUNTER — Ambulatory Visit: Payer: Managed Care, Other (non HMO) | Admitting: Physician Assistant

## 2022-08-16 ENCOUNTER — Ambulatory Visit: Payer: Managed Care, Other (non HMO) | Admitting: Physician Assistant

## 2022-08-16 VITALS — BP 132/81 | HR 66 | Ht 63.0 in | Wt 216.0 lb

## 2022-08-16 DIAGNOSIS — L089 Local infection of the skin and subcutaneous tissue, unspecified: Secondary | ICD-10-CM | POA: Diagnosis not present

## 2022-08-16 DIAGNOSIS — M722 Plantar fascial fibromatosis: Secondary | ICD-10-CM | POA: Diagnosis not present

## 2022-08-16 DIAGNOSIS — G8929 Other chronic pain: Secondary | ICD-10-CM

## 2022-08-16 DIAGNOSIS — Z1159 Encounter for screening for other viral diseases: Secondary | ICD-10-CM

## 2022-08-16 DIAGNOSIS — Z79899 Other long term (current) drug therapy: Secondary | ICD-10-CM | POA: Diagnosis not present

## 2022-08-16 DIAGNOSIS — M25571 Pain in right ankle and joints of right foot: Secondary | ICD-10-CM

## 2022-08-16 MED ORDER — CLINDAMYCIN PHOS-BENZOYL PEROX 1-5 % EX GEL
Freq: Two times a day (BID) | CUTANEOUS | 1 refills | Status: DC
Start: 2022-08-16 — End: 2023-08-01

## 2022-08-16 NOTE — Progress Notes (Signed)
Established Patient Office Visit  Subjective   Patient ID: Julie Reese, female    DOB: 1975-08-26  Age: 47 y.o. MRN: [id number]  Chief Complaint  Patient presents with   Ankle Pain    F/u    HPI Pt is a 47 yo female who presents to the clinic to follow up on right ankle pain from sprain.   She has completed PT and seen podiatry. Right ankle pain has nearly resolved. She now has more plantar and heel pain in the right foot. She wears good supportive shoe with inserts and keep pain at bay but if she wears dress shoes she starts to hurt.   Podiatry last note Plan: Discussed exam findings and her progress at today's visit.  I recommend she continue physical therapy as she is progressing very well with this.  Also discussed she may benefit from an at home TENS units.  She will discuss this with her therapist to see if she agrees.  She does not need to follow-up with [place] unless her pain continues and does not improve with physical therapy..  Discussed that she may benefit from an injection in the future but for now would would hold off.   .. 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    Objective:     BP 132/81   Pulse 66   Ht 5\' 3"  (1.6 m)   Wt 216 lb (98 kg)   SpO2 100%   BMI 38.26 kg/m  BP Readings from Last 3 Encounters:  08/16/22 132/81  01/24/22 (!) 124/45  10/18/21 136/63   Wt Readings from Last 3 Encounters:  08/16/22 216 lb (98 kg)  01/24/22 210 lb (95.3 kg)  06/29/20 211 lb (95.7 kg)        Physical Exam Constitutional:      Appearance: Normal appearance. She is obese.  HENT:     Head: Normocephalic.  Cardiovascular:     Rate and Rhythm: Normal rate and regular rhythm.     Pulses: Normal pulses.     Heart sounds: Normal heart sounds.  Pulmonary:     Effort: Pulmonary effort is normal.     Breath sounds: Normal breath sounds.  Musculoskeletal:     Comments: Right ankle NROM and 5/5 strength Tenderness is over heel and fascia   Skin:    Comments: Left nare pustule  Neurological:     General: No focal deficit present.     Mental Status: She is alert and oriented to person, place, and time.  Psychiatric:        Mood and Affect: Mood normal.       The 10-year ASCVD risk score (Arnett DK, et al., 2019) is: 2.4%    Assessment & Plan:  Marland KitchenMarland KitchenEfthimia was seen today for ankle pain.  Diagnoses and all orders for this visit:  Plantar fasciitis  Encounter for hepatitis C screening test for low risk patient -     Hepatitis C Antibody  Medication management -     COMPLETE METABOLIC PANEL WITH GFR  Pustule -     clindamycin-benzoyl peroxide (BENZACLIN) gel; Apply topically 2 (two) times daily.  Chronic pain of right ankle   Ankle pain is stable and resolving.  New problem is plantar fasciitis Start exercises given Ice area twice a day  Use voltaren gel  Consider plantar fascia brace at night Wear good supportive shoes Renewed not to wear tennis shoes at work due to extra need for support Benzaclin for acne pustule   Tandy Gaw, PA-C

## 2022-08-16 NOTE — Patient Instructions (Signed)
Plantar Fasciitis Rehab Ask your health care provider which exercises are safe for you. Do exercises exactly as told by your health care provider and adjust them as directed. It is normal to feel mild stretching, pulling, tightness, or discomfort as you do these exercises. Stop right away if you feel sudden pain or your pain gets worse. Do not begin these exercises until told by your health care provider. Stretching and range-of-motion exercises These exercises warm up your muscles and joints and improve the movement and flexibility of your foot. These exercises also help to relieve pain. Plantar fascia stretch  Sit with your left / right leg crossed over your opposite knee. Hold your heel with one hand with that thumb near your arch. With your other hand, hold your toes and gently pull them back toward the top of your foot. You should feel a stretch on the base (bottom) of your toes, or the bottom of your foot (plantar fascia), or both. Hold this stretch for__________ seconds. Slowly release your toes and return to the starting position. Repeat __________ times. Complete this exercise __________ times a day. Gastrocnemius stretch, standing This exercise is also called a calf (gastroc) stretch. It stretches the muscles in the back of the upper calf. Stand with your hands against a wall. Extend your left / right leg behind you, and bend your front knee slightly. Keeping your heels on the floor, your toes facing forward, and your back knee straight, shift your weight toward the wall. Do not arch your back. You should feel a gentle stretch in your upper calf. Hold this position for __________ seconds. Repeat __________ times. Complete this exercise __________ times a day. Soleus stretch, standing This exercise is also called a calf (soleus) stretch. It stretches the muscles in the back of the lower calf. Stand with your hands against a wall. Extend your left / right leg behind you, and bend your  front knee slightly. Keeping your heels on the floor and your toes facing forward, bend your back knee and shift your weight slightly over your back leg. You should feel a gentle stretch deep in your lower calf. Hold this position for __________ seconds. Repeat __________ times. Complete this exercise __________ times a day. Gastroc and soleus stretch, standing step This exercise stretches the muscles in the back of the lower leg. These muscles are in the upper calf (gastrocnemius) and the lower calf (soleus). Stand with the ball of your left / right foot on the front of a step. The ball of your foot is on the walking surface, right under your toes. Keep your other foot firmly on the same step. Hold on to the wall or a railing for balance. Slowly lift your other foot, allowing your body weight to press your heel down over the edge of the front of the step. Keep knee straight and unbent. You should feel a stretch in your calf. Hold this position for __________ seconds. Return both feet to the step. Repeat this exercise with a slight bend in your left / right knee. Repeat __________ times with your left / right knee straight and __________ times with your left / right knee bent. Complete this exercise __________ times a day. Balance exercise This exercise builds your balance and strength control of your arch to help take pressure off your plantar fascia. Single leg stand If this exercise is too easy, you can try it with your eyes closed or while standing on a pillow. Without shoes, stand near a   railing or in a doorway. You may hold on to the railing or door frame as needed. Stand on your left / right foot. Keep your big toe down on the floor and lift the arch of your foot. You should feel a stretch across the bottom of your foot and your arch. Do not let your foot roll inward. Hold this position for __________ seconds. Repeat __________ times. Complete this exercise __________ times a day. This  information is not intended to replace advice given to you by your health care provider. Make sure you discuss any questions you have with your health care provider. Document Revised: 01/08/2020 Document Reviewed: 01/08/2020 Elsevier Patient Education  2023 [employer]  

## 2022-08-17 ENCOUNTER — Encounter: Payer: Self-pay | Admitting: Physician Assistant

## 2022-08-17 LAB — HEPATITIS C ANTIBODY: Hepatitis C Ab: NONREACTIVE

## 2022-08-17 LAB — COMPLETE METABOLIC PANEL WITH GFR
AG Ratio: 1.6 (calc) (ref 1.0–2.5)
ALT: 14 U/L (ref 6–29)
AST: 14 U/L (ref 10–30)
Albumin: 4.5 g/dL (ref 3.6–5.1)
Alkaline phosphatase (APISO): 52 U/L (ref 31–125)
BUN: 13 mg/dL (ref 7–25)
CO2: 23 mmol/L (ref 20–32)
Calcium: 9.3 mg/dL (ref 8.6–10.2)
Chloride: 106 mmol/L (ref 98–110)
Creat: 0.8 mg/dL (ref 0.50–0.99)
Globulin: 2.9 g/dL (calc) (ref 1.9–3.7)
Glucose, Bld: 82 mg/dL (ref 65–99)
Potassium: 3.8 mmol/L (ref 3.5–5.3)
Sodium: 140 mmol/L (ref 135–146)
Total Bilirubin: 0.5 mg/dL (ref 0.2–1.2)
Total Protein: 7.4 g/dL (ref 6.1–8.1)
eGFR: 93 mL/min/{1.73_m2} (ref >=60)

## 2022-08-17 NOTE — Progress Notes (Signed)
Kidney, liver, glucose look great. Hep C pending.

## 2022-08-18 ENCOUNTER — Encounter: Payer: Self-pay | Admitting: Physician Assistant

## 2022-08-18 NOTE — Progress Notes (Signed)
Hep C negative.

## 2022-08-21 ENCOUNTER — Encounter: Payer: Self-pay | Admitting: Physician Assistant

## 2022-09-08 ENCOUNTER — Encounter: Payer: Self-pay | Admitting: Physician Assistant

## 2022-11-22 ENCOUNTER — Encounter: Payer: Self-pay | Admitting: Physician Assistant

## 2023-01-02 ENCOUNTER — Encounter: Payer: Self-pay | Admitting: Physician Assistant

## 2023-01-03 NOTE — Telephone Encounter (Signed)
Can you abstract her covid vaccine please.

## 2023-02-14 ENCOUNTER — Encounter: Payer: Self-pay | Admitting: Physician Assistant

## 2023-02-14 NOTE — Telephone Encounter (Signed)
I think needs 6 months f/u.  I think for [place] to be able to request an accomodation from work we need updated visit. Last OV was 6 mo ago

## 2023-08-01 ENCOUNTER — Ambulatory Visit (INDEPENDENT_AMBULATORY_CARE_PROVIDER_SITE_OTHER): Admitting: Physician Assistant

## 2023-08-01 ENCOUNTER — Encounter: Payer: Self-pay | Admitting: Physician Assistant

## 2023-08-01 VITALS — BP 129/68 | HR 71 | Temp 98.9°F | Ht 63.0 in | Wt 215.0 lb

## 2023-08-01 DIAGNOSIS — E785 Hyperlipidemia, unspecified: Secondary | ICD-10-CM | POA: Diagnosis not present

## 2023-08-01 DIAGNOSIS — Z0000 Encounter for general adult medical examination without abnormal findings: Secondary | ICD-10-CM

## 2023-08-01 DIAGNOSIS — E66811 Obesity, class 1: Secondary | ICD-10-CM

## 2023-08-01 DIAGNOSIS — Z6830 Body mass index (BMI) 30.0-30.9, adult: Secondary | ICD-10-CM

## 2023-08-01 DIAGNOSIS — Z79899 Other long term (current) drug therapy: Secondary | ICD-10-CM | POA: Diagnosis not present

## 2023-08-01 DIAGNOSIS — H6993 Unspecified Eustachian tube disorder, bilateral: Secondary | ICD-10-CM

## 2023-08-01 DIAGNOSIS — Z1211 Encounter for screening for malignant neoplasm of colon: Secondary | ICD-10-CM

## 2023-08-01 DIAGNOSIS — E6609 Other obesity due to excess calories: Secondary | ICD-10-CM

## 2023-08-01 MED ORDER — FLUTICASONE PROPIONATE 50 MCG/ACT NA SUSP
2.0000 | Freq: Every day | NASAL | 0 refills | Status: AC
Start: 2023-08-01 — End: ?

## 2023-08-01 MED ORDER — METHYLPREDNISOLONE 4 MG PO TBPK
ORAL_TABLET | ORAL | 0 refills | Status: AC
Start: 2023-08-01 — End: ?

## 2023-08-01 NOTE — Patient Instructions (Addendum)
 Vitamin D  at least 1000 units a day with calcium  4 servings a day.  Walking 150 minutes a week Medrol  dose pack for ears Use flonase  with xyzal  Eustachian Tube Dysfunction  Eustachian tube dysfunction refers to a condition in which a blockage develops in the narrow passage that connects the middle ear to the back of the nose (eustachian tube). The eustachian tube regulates air pressure in the middle ear by letting air move between the ear and nose. It also helps to drain fluid from the middle ear space. Eustachian tube dysfunction can affect one or both ears. When the eustachian tube does not function properly, air pressure, fluid, or both can build up in the middle ear. What are the causes? This condition occurs when the eustachian tube becomes blocked or cannot open normally. Common causes of this condition include: Ear infections. Colds and other infections that affect the nose, mouth, and throat (upper respiratory tract). Allergies. Irritation from cigarette smoke. Irritation from stomach acid coming up into the esophagus (gastroesophageal reflux). The esophagus is the part of the body that moves food from the mouth to the stomach. Sudden changes in air pressure, such as from descending in an airplane or scuba diving. Abnormal growths in the nose or throat, such as: Growths that line the nose (nasal polyps). Abnormal growth of cells (tumors). Enlarged tissue at the back of the throat (adenoids). What increases the risk? You are more likely to develop this condition if: You smoke. You are overweight. You are a child who has: Certain birth defects of the mouth, such as cleft palate. Large tonsils or adenoids. What are the signs or symptoms? Common symptoms of this condition include: A feeling of fullness in the ear. Ear pain. Clicking or popping noises in the ear. Ringing in the ear (tinnitus). Hearing loss. Loss of balance. Dizziness. Symptoms may get worse when the air  pressure around you changes, such as when you travel to an area of high elevation, fly on an airplane, or go scuba diving. How is this diagnosed? This condition may be diagnosed based on: Your symptoms. A physical exam of your ears, nose, and throat. Tests, such as those that measure: The movement of your eardrum. Your hearing (audiometry). How is this treated? Treatment depends on the cause and severity of your condition. In mild cases, you may relieve your symptoms by moving air into your ears. This is called "popping the ears." In more severe cases, or if you have symptoms of fluid in your ears, treatment may include: Medicines to relieve congestion (decongestants). Medicines that treat allergies (antihistamines). Nasal sprays or ear drops that contain medicines that reduce swelling (steroids). A procedure to drain the fluid in your eardrum. In this procedure, a small tube may be placed in the eardrum to: Drain the fluid. Restore the air in the middle ear space. A procedure to insert a balloon device through the nose to inflate the opening of the eustachian tube (balloon dilation). Follow these instructions at home: Lifestyle Do not do any of the following until your health care provider approves: Travel to high altitudes. Fly in airplanes. Work in a Estate agent or room. Scuba dive. Do not use any products that contain nicotine or tobacco. These products include cigarettes, chewing tobacco, and vaping devices, such as e-cigarettes. If you need help quitting, ask your health care provider. Keep your ears dry. Wear fitted earplugs during showering and bathing. Dry your ears completely after. General instructions Take over-the-counter and prescription medicines only  as told by your health care provider. Use techniques to help pop your ears as recommended by your health care provider. These may include: Chewing gum. Yawning. Frequent, forceful swallowing. Closing your mouth,  holding your nose closed, and gently blowing as if you are trying to blow air out of your nose. Keep all follow-up visits. This is important. Contact a health care provider if: Your symptoms do not go away after treatment. Your symptoms come back after treatment. You are unable to pop your ears. You have: A fever. Pain in your ear. Pain in your head or neck. Fluid draining from your ear. Your hearing suddenly changes. You become very dizzy. You lose your balance. Get help right away if: You have a sudden, severe increase in any of your symptoms. Summary Eustachian tube dysfunction refers to a condition in which a blockage develops in the eustachian tube. It can be caused by ear infections, allergies, inhaled irritants, or abnormal growths in the nose or throat. Symptoms may include ear pain or fullness, hearing loss, or ringing in the ears. Mild cases are treated with techniques to unblock the ears, such as yawning or chewing gum. More severe cases are treated with medicines or procedures. This information is not intended to replace advice given to you by your health care provider. Make sure you discuss any questions you have with your health care provider. Document Revised: 06/07/2020 Document Reviewed: 06/07/2020 Elsevier Patient Education  2024 [employer].

## 2023-08-02 ENCOUNTER — Encounter: Payer: Self-pay | Admitting: Physician Assistant

## 2023-08-02 LAB — CMP14+EGFR
ALT: 13 IU/L (ref 0–32)
AST: 16 IU/L (ref 0–40)
Albumin: 4.3 g/dL (ref 3.9–4.9)
Alkaline Phosphatase: 67 IU/L (ref 44–121)
BUN/Creatinine Ratio: 12 (ref 9–23)
BUN: 11 mg/dL (ref 6–24)
Bilirubin Total: 0.6 mg/dL (ref 0.0–1.2)
CO2: 22 mmol/L (ref 20–29)
Calcium: 9.1 mg/dL (ref 8.7–10.2)
Chloride: 103 mmol/L (ref 96–106)
Creatinine, Ser: 0.95 mg/dL (ref 0.57–1.00)
Globulin, Total: 2.7 g/dL (ref 1.5–4.5)
Glucose: 80 mg/dL (ref 70–99)
Potassium: 4.1 mmol/L (ref 3.5–5.2)
Sodium: 139 mmol/L (ref 134–144)
Total Protein: 7 g/dL (ref 6.0–8.5)
eGFR: 75 mL/min/{1.73_m2} (ref >59)

## 2023-08-02 LAB — CBC WITH DIFFERENTIAL/PLATELET
Basophils Absolute: 0 10*3/uL (ref 0.0–0.2)
Basos: 1 %
EOS (ABSOLUTE): 0.5 10*3/uL — ABNORMAL HIGH (ref 0.0–0.4)
Eos: 10 %
Hematocrit: 38.4 % (ref 34.0–46.6)
Hemoglobin: 13 g/dL (ref 11.1–15.9)
Immature Grans (Abs): 0 10*3/uL (ref 0.0–0.1)
Immature Granulocytes: 0 %
Lymphocytes Absolute: 1.8 10*3/uL (ref 0.7–3.1)
Lymphs: 39 %
MCH: 28.4 pg (ref 26.6–33.0)
MCHC: 33.9 g/dL (ref 31.5–35.7)
MCV: 84 fL (ref 79–97)
Monocytes Absolute: 0.5 10*3/uL (ref 0.1–0.9)
Monocytes: 11 %
Neutrophils Absolute: 1.8 10*3/uL (ref 1.4–7.0)
Neutrophils: 39 %
Platelets: 339 10*3/uL (ref 150–450)
RBC: 4.57 x10E6/uL (ref 3.77–5.28)
RDW: 12 % (ref 11.7–15.4)
WBC: 4.6 10*3/uL (ref 3.4–10.8)

## 2023-08-02 LAB — LIPID PANEL
Chol/HDL Ratio: 5.9 ratio — ABNORMAL HIGH (ref 0.0–4.4)
Cholesterol, Total: 213 mg/dL — ABNORMAL HIGH (ref 100–199)
HDL: 36 mg/dL — ABNORMAL LOW (ref >39)
LDL Chol Calc (NIH): 165 mg/dL — ABNORMAL HIGH (ref 0–99)
Triglycerides: 69 mg/dL (ref 0–149)
VLDL Cholesterol Cal: 12 mg/dL (ref 5–40)

## 2023-08-02 LAB — TSH: TSH: 1.18 u[IU]/mL (ref 0.450–4.500)

## 2023-08-02 LAB — VITAMIN D 25 HYDROXY (VIT D DEFICIENCY, FRACTURES): Vit D, 25-Hydroxy: 31.9 ng/mL (ref 30.0–100.0)

## 2023-08-02 NOTE — Progress Notes (Signed)
 Julie Reese,   Hemoglobin looks good.  Thyroid normal.  Kidney, liver, glucose look good.  Vitamin D  better but make sure taking at least 2000 units daily.   Cholesterol not to goal. 10 year risk low. Keep working on diet low in fried/fatty/processed foods and getting 150 minutes of exercise a week. Follow up in 1 year.   [name].The 10-year ASCVD risk score (Arnett DK, et al., 2019) is: 2.6%   Values used to calculate the score:     Age: 48 years     Sex: Female     Is Non-Hispanic African American: Yes     Diabetic: No     Tobacco smoker: No     Systolic Blood Pressure: 129 mmHg     Is BP treated: No     HDL Cholesterol: 36 mg/dL     Total Cholesterol: 213 mg/dL

## 2023-08-06 ENCOUNTER — Encounter: Payer: Self-pay | Admitting: Physician Assistant

## 2023-08-06 NOTE — Progress Notes (Signed)
 Complete physical exam  Patient: Julie Reese   DOB: [birth year]   48 y.o. Female  MRN: [id number]  Subjective:    Chief Complaint  Patient presents with   Annual Exam    Bi lateral ear fullness, annual labs, phq-gad    Julie Reese is a 48 y.o. female who presents today for a complete physical exam. She reports consuming a general diet.  Pt walks regularly.  She generally feels well. She reports sleeping well. She does have additional problems to discuss today.   She is having bilateral ear pressure and popping and fullness. She wants to make sure they do not need cleaning out. She denies any pain, fever, chills, URI symptoms.    Most recent fall risk assessment:    08/06/2023   11:58 AM  Fall Risk   Falls in the past year? 0  Number falls in past yr: 0  Injury with Fall? 0  Risk for fall due to : No Fall Risks  Follow up Falls evaluation completed     Most recent depression screenings:    08/01/2023   10:56 AM 01/24/2022    1:34 PM  PHQ 2/9 Scores  PHQ - 2 Score 0 0  PHQ- 9 Score 3     Vision:Within last year and Dental: No current dental problems  Patient Active Problem List   Diagnosis Date Noted   Dysfunction of both eustachian tubes 08/01/2023   Class 1 obesity due to excess calories without serious comorbidity with body mass index (BMI) of 30.0 to 30.9 in adult 08/01/2023   Plantar fasciitis 08/16/2022   Pustule 08/16/2022   Chronic pain of right ankle 11/08/2021   Anxiety 07/05/2020   Depressed mood 07/05/2020   History of miscarriage 05/13/2019   Bilateral breast cysts 02/05/2019   Breast mass, right 02/03/2019   Left breast mass 02/03/2019   Class 1 obesity due to excess calories without serious comorbidity with body mass index (BMI) of 34.0 to 34.9 in adult 12/19/2018   Dyslipidemia 06/04/2017   Patellar tendonitis of left knee 10/18/2016   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    Past Surgical History:  Procedure Laterality Date   WISDOM TOOTH EXTRACTION     Allergies  Allergen Reactions   Doxycycline      RASH on face. She has been out in sun a lot.    Percocet [Oxycodone-Acetaminophen] Other (See Comments)    "makes loopy and unaware of self"      Patient Care Team: Julie Withrow L, PA-C as PCP - General (Family Medicine)   Outpatient Medications Prior to Visit  Medication Sig   Multiple Vitamin (MULTIVITAMIN) capsule Take by mouth.   Multiple Vitamins-Minerals (IMMUNE SUPPORT PO) Take 1 tablet by mouth daily. vitafusion   cetirizine (ZYRTEC) 10 MG tablet Take 10 mg by mouth daily. (Patient not taking: Reported on 08/01/2023)   [DISCONTINUED] clindamycin -benzoyl peroxide (BENZACLIN) gel Apply topically 2 (two) times daily.   No facility-administered medications prior to visit.    Review of Systems  All other systems reviewed and are negative.         Objective:     BP 129/68   Pulse 71   Temp 98.9 F (37.2 C) (Oral)   Ht 5\' 3"  (1.6 m)   Wt 215 lb (97.5 kg)   SpO2 99%   BMI 38.09 kg/m  BP Readings from Last 3 Encounters:  08/01/23 129/68  08/16/22 132/81  01/24/22 (!) 124/45   Wt Readings from Last 3 Encounters:  08/01/23 215 lb (97.5 kg)  08/16/22 216 lb (98 kg)  01/24/22 210 lb (95.3 kg)      Physical Exam  BP 129/68   Pulse 71   Temp 98.9 F (37.2 C) (Oral)   Ht 5\' 3"  (1.6 m)   Wt 215 lb (97.5 kg)   SpO2 99%   BMI 38.09 kg/m   General Appearance:    Alert, cooperative, obese no distress, appears stated age  Head:    Normocephalic, without obvious  abnormality, atraumatic  Eyes:    PERRL, conjunctiva/corneas clear, EOM's intact, fundi    benign, both eyes  Ears:    Normal TM's and external ear canals, both ears.   Nose:   Nares normal, septum midline, mucosa normal, no drainage    or sinus tenderness  Throat:   Lips, mucosa, and tongue normal; teeth and gums normal  Neck:   Supple, symmetrical, trachea midline, no adenopathy;    thyroid:  no enlargement/tenderness/nodules; no carotid   bruit or JVD  Back:     Symmetric, no curvature, ROM normal, no CVA tenderness  Lungs:     Clear to auscultation bilaterally, respirations unlabored  Chest Wall:    No tenderness or deformity   Heart:    Regular rate and rhythm, S1 and S2 normal, no murmur, rub   or gallop     Abdomen:     Soft, non-tender, bowel sounds active all four quadrants,    no masses, no organomegaly        Extremities:   Extremities normal, atraumatic, no cyanosis or edema  Pulses:   2+ and symmetric all extremities  Skin:   Skin color, texture, turgor normal, no rashes or lesions  Lymph nodes:   Cervical, supraclavicular, and axillary nodes normal  Neurologic:   CNII-XII intact, normal strength, sensation and reflexes    throughout       Assessment & Plan:    Routine Health Maintenance and Physical Exam  Immunization History  Administered Date(s) Administered   PFIZER Comirnaty(Gray Top)Covid-19 Tri-Sucrose Vaccine 05/13/2020   PFIZER(Purple Top)SARS-COV-2 Vaccination 08/29/2019, 09/19/2019   Pfizer(Comirnaty)Fall Seasonal Vaccine 12 years and older 12/29/2022   Tdap 05/30/2016    Health Maintenance  Topic Date Due   Colonoscopy  Never done   COVID-19 Vaccine (5 - Pfizer risk 2024-25 season) 08/17/2023 (Originally 06/28/2023)   INFLUENZA VACCINE  11/09/2023   DTaP/Tdap/Td (2 - Td or Tdap) 05/30/2026   Cervical Cancer Screening (HPV/Pap Cotest)  01/25/2027   Hepatitis C Screening  Completed   HIV Screening  Completed   HPV VACCINES  Aged Out    Meningococcal B Vaccine  Aged Out    Discussed health benefits of physical activity, and encouraged her to engage in regular exercise appropriate for her age and condition.  Julie Reese was seen today for annual exam.  Diagnoses and all orders for this visit:  Routine physical examination -     CBC with Differential/Platelet -     CMP14+EGFR -  Lipid panel -     TSH -     Ambulatory referral to Gastroenterology -     VITAMIN D  25 Hydroxy (Vit-D Deficiency, Fractures)  Medication management -     CBC with Differential/Platelet -     CMP14+EGFR -     VITAMIN D  25 Hydroxy (Vit-D Deficiency, Fractures)  Dyslipidemia -     Lipid panel  Colon cancer screening -     Ambulatory referral to Gastroenterology  Class 1 obesity due to excess calories without serious comorbidity with body mass index (BMI) of 30.0 to 30.9 in adult -     CBC with Differential/Platelet -     CMP14+EGFR -     Lipid panel -     TSH  Dysfunction of both eustachian tubes -     fluticasone  (FLONASE ) 50 MCG/ACT nasal spray; Place 2 sprays into both nostrils daily. -     methylPREDNISolone  (MEDROL  DOSEPAK) 4 MG TBPK tablet; Take as directed by package insert.   .. Discussed 150 minutes of exercise a week.  Encouraged vitamin D  1000 units and Calcium  1300mg  or 4 servings of dairy a day.  Fasting labs ordered Vitals look great.  PHQ no concerns Colonoscopy ordered Mammogram and Pap UTD Vaccines UTD Discussed ETD and sent medrol  dose and flonase  Follow up as needed if symptoms persist or worsen     Sandy Crumb, PA-C

## 2023-10-19 ENCOUNTER — Encounter: Payer: Self-pay | Admitting: Physician Assistant

## 2023-12-11 ENCOUNTER — Encounter: Payer: Self-pay | Admitting: Sports Medicine
